# Patient Record
Sex: Female | Born: 1960 | Race: White | Hispanic: No | Marital: Single | Smoking: Current every day smoker
Health system: Southern US, Community
[De-identification: ages and names within clinical notes are randomized; demographics above are authoritative.]

## PROBLEM LIST (undated history)

## (undated) DIAGNOSIS — F1910 Other psychoactive substance abuse, uncomplicated: Secondary | ICD-10-CM

## (undated) DIAGNOSIS — T7840XA Allergy, unspecified, initial encounter: Secondary | ICD-10-CM

## (undated) DIAGNOSIS — R011 Cardiac murmur, unspecified: Secondary | ICD-10-CM

## (undated) DIAGNOSIS — J449 Chronic obstructive pulmonary disease, unspecified: Secondary | ICD-10-CM

## (undated) DIAGNOSIS — I10 Essential (primary) hypertension: Secondary | ICD-10-CM

## (undated) HISTORY — DX: Other psychoactive substance abuse, uncomplicated: F19.10

## (undated) HISTORY — DX: Allergy, unspecified, initial encounter: T78.40XA

## (undated) HISTORY — PX: APPENDECTOMY: SHX54

## (undated) HISTORY — DX: Cardiac murmur, unspecified: R01.1

## (undated) HISTORY — PX: HERNIA REPAIR: SHX51

---

## 2008-04-01 ENCOUNTER — Emergency Department: Payer: Self-pay | Admitting: Emergency Medicine

## 2010-03-28 ENCOUNTER — Emergency Department (HOSPITAL_COMMUNITY): Admission: EM | Admit: 2010-03-28 | Discharge: 2010-03-28 | Payer: Self-pay | Admitting: Emergency Medicine

## 2010-09-21 ENCOUNTER — Inpatient Hospital Stay: Payer: Self-pay | Admitting: Psychiatry

## 2011-02-05 LAB — BASIC METABOLIC PANEL
BUN: 8 mg/dL (ref 6–23)
CO2: 25 mEq/L (ref 19–32)
Calcium: 9.3 mg/dL (ref 8.4–10.5)
Calcium: 9.3 mg/dL (ref 8.4–10.5)
Chloride: 105 mEq/L (ref 96–112)
Creatinine, Ser: 0.68 mg/dL (ref 0.4–1.2)
GFR calc Af Amer: >60 mL/min (ref >60)
GFR calc non Af Amer: >60 mL/min (ref >60)
Glucose, Bld: 113 mg/dL — ABNORMAL HIGH (ref 70–99)
Glucose, Bld: 118 mg/dL — ABNORMAL HIGH (ref 70–99)
Sodium: 138 mEq/L (ref 135–145)

## 2011-02-05 LAB — ETHANOL: Alcohol, Ethyl (B): <5 mg/dL (ref 0–10)

## 2011-02-05 LAB — DIFFERENTIAL
Basophils Relative: 1 % (ref 0–1)
Eosinophils Absolute: 0 10*3/uL (ref 0.0–0.7)
Eosinophils Relative: 0 % (ref 0–5)
Lymphs Abs: 2.5 10*3/uL (ref 0.7–4.0)
Monocytes Relative: 6 % (ref 3–12)
Neutrophils Relative %: 50 % (ref 43–77)

## 2011-02-05 LAB — HEPATIC FUNCTION PANEL
Albumin: 4.3 g/dL (ref 3.5–5.2)
Alkaline Phosphatase: 52 U/L (ref 39–117)
Bilirubin, Direct: <0.1 mg/dL (ref 0.0–0.3)
Total Bilirubin: 0.5 mg/dL (ref 0.3–1.2)

## 2011-02-05 LAB — TRICYCLICS SCREEN, URINE: TCA Scrn: NOT DETECTED

## 2011-02-05 LAB — CBC
HCT: 36.5 % (ref 36.0–46.0)
MCHC: 34.3 g/dL (ref 30.0–36.0)
MCV: 106 fL — ABNORMAL HIGH (ref 78.0–100.0)
RBC: 3.44 MIL/uL — ABNORMAL LOW (ref 3.87–5.11)

## 2011-02-05 LAB — RAPID URINE DRUG SCREEN, HOSP PERFORMED
Cocaine: NOT DETECTED
Tetrahydrocannabinol: NOT DETECTED

## 2011-02-05 LAB — SALICYLATE LEVEL: Salicylate Lvl: <4 mg/dL (ref 2.8–20.0)

## 2012-11-30 ENCOUNTER — Emergency Department: Payer: Self-pay | Admitting: Emergency Medicine

## 2012-11-30 LAB — DRUG SCREEN, URINE
Barbiturates, Ur Screen: NEGATIVE (ref ?–200)
Cannabinoid 50 Ng, Ur ~~LOC~~: NEGATIVE (ref ?–50)
Opiate, Ur Screen: POSITIVE (ref ?–300)
Tricyclic, Ur Screen: NEGATIVE (ref ?–1000)

## 2012-11-30 LAB — COMPREHENSIVE METABOLIC PANEL
Albumin: 4.3 g/dL (ref 3.4–5.0)
Anion Gap: 8 (ref 7–16)
BUN: 17 mg/dL (ref 7–18)
EGFR (Non-African Amer.): >60
Potassium: 3.3 mmol/L — ABNORMAL LOW (ref 3.5–5.1)
SGOT(AST): 20 U/L (ref 15–37)

## 2012-11-30 LAB — ACETAMINOPHEN LEVEL: Acetaminophen: <2 ug/mL

## 2012-11-30 LAB — CBC
Platelet: 200 10*3/uL (ref 150–440)
WBC: 5.8 10*3/uL (ref 3.6–11.0)

## 2013-12-08 ENCOUNTER — Emergency Department: Payer: Self-pay | Admitting: Emergency Medicine

## 2013-12-08 LAB — COMPREHENSIVE METABOLIC PANEL
ALK PHOS: 71 U/L
ALT: 29 U/L (ref 12–78)
ANION GAP: 5 — AB (ref 7–16)
AST: 21 U/L (ref 15–37)
Albumin: 4.1 g/dL (ref 3.4–5.0)
BILIRUBIN TOTAL: 0.3 mg/dL (ref 0.2–1.0)
BUN: 14 mg/dL (ref 7–18)
CALCIUM: 9.2 mg/dL (ref 8.5–10.1)
CHLORIDE: 105 mmol/L (ref 98–107)
CO2: 30 mmol/L (ref 21–32)
CREATININE: 0.85 mg/dL (ref 0.60–1.30)
EGFR (Non-African Amer.): >60
GLUCOSE: 88 mg/dL (ref 65–99)
OSMOLALITY: 279 (ref 275–301)
POTASSIUM: 3.9 mmol/L (ref 3.5–5.1)
SODIUM: 140 mmol/L (ref 136–145)
TOTAL PROTEIN: 7.9 g/dL (ref 6.4–8.2)

## 2013-12-08 LAB — CBC WITH DIFFERENTIAL/PLATELET
BASOS PCT: 0.4 %
Basophil #: 0 10*3/uL (ref 0.0–0.1)
Eosinophil #: 0 10*3/uL (ref 0.0–0.7)
Eosinophil %: 0.1 %
HCT: 36.7 % (ref 35.0–47.0)
HGB: 12.4 g/dL (ref 12.0–16.0)
LYMPHS ABS: 2.8 10*3/uL (ref 1.0–3.6)
LYMPHS PCT: 51.7 %
MCH: 34.2 pg — ABNORMAL HIGH (ref 26.0–34.0)
MCHC: 33.9 g/dL (ref 32.0–36.0)
MCV: 101 fL — ABNORMAL HIGH (ref 80–100)
MONO ABS: 0.4 x10 3/mm (ref 0.2–0.9)
Monocyte %: 7.6 %
Neutrophil #: 2.2 10*3/uL (ref 1.4–6.5)
Neutrophil %: 40.2 %
Platelet: 174 10*3/uL (ref 150–440)
RBC: 3.64 10*6/uL — ABNORMAL LOW (ref 3.80–5.20)
RDW: 13.4 % (ref 11.5–14.5)
WBC: 5.5 10*3/uL (ref 3.6–11.0)

## 2013-12-08 LAB — LIPASE, BLOOD: Lipase: 80 U/L (ref 73–393)

## 2013-12-08 LAB — URINALYSIS, COMPLETE
BACTERIA: NONE SEEN
Bilirubin,UR: NEGATIVE
Glucose,UR: NEGATIVE mg/dL (ref 0–75)
KETONE: NEGATIVE
LEUKOCYTE ESTERASE: NEGATIVE
Nitrite: NEGATIVE
PH: 5 (ref 4.5–8.0)
Protein: NEGATIVE
RBC, UR: NONE SEEN /HPF (ref 0–5)
Specific Gravity: 1.006 (ref 1.003–1.030)

## 2013-12-08 LAB — TROPONIN I

## 2014-03-04 ENCOUNTER — Emergency Department: Payer: Self-pay | Admitting: Emergency Medicine

## 2015-03-10 NOTE — Consult Note (Signed)
PATIENT NAME:  Alexa Thompson, Alexa Thompson MR#:  [id number] DATE OF BIRTH:  14-Dec-1960  DATE OF ADMISSION:  11/30/2012  DATE OF CONSULTATION:  11/30/2012  REFERRING PHYSICIAN:   Dr. Conni Slipper.   CONSULTING PHYSICIAN:  Leiana Rund B. Livingston Denner, MD  REASON FOR CONSULTATION:  To evaluate the patient in need of narcotic detox.   IDENTIFYING DATA:  The patient is a 54 year old female with history of narcotic abuse.   CHIEF COMPLAINT:  "I need treatment."   HISTORY OF PRESENT ILLNESS:  The patient was hospitalized at [facility] in 2011 for a similar problem.   At that time, she was transferred to [facility] rehab facility in [place]. She did very well for 4 or 5 months using naltrexone prescribed at the rehab facility. Her husband, who despises drugs, requested that she goes off naltrexone. He felt that using naltrexone is just substituting one drug for another. She immediately relapsed on pain killers and has been using them daily ever since. She now was confronted by her husband, who wants to separate if she does not stop. She accumulated debts, created problems in her marriage, and threatened not to be able to see her grandchildren, which has been a threat all along that the husband uses to help her get treatment. She came to the hospital asking for an opioid detox. She denies any symptoms of depression, anxiety, psychosis. No symptoms of bipolar mania. She denies, other than opioids, substance use, including alcohol.   PAST PSYCHIATRIC HISTORY:  She became addicted to pain killers after dental surgery more than 10 years ago. She has not been able to stay sober in spite of efforts, except for the period of time when she was hospitalized at [facility] and on naltrexone.  She was hospitalized for detox in 2011. She denies suicide attempts. She does not have a psychiatrist.   FAMILY PSYCHIATRIC HISTORY:  None reported.   PAST MEDICAL HISTORY:  None.   MEDICATIONS ON ADMISSION:  None.   ALLERGIES:  No  known drug allergies.   SOCIAL HISTORY:  She is originally from [place]. She dropped out in the 11th grade but got her GED. She is in her second marriage. The husband is supportive but demands that she stays clean. She works at [employer] and needs to keep this job; therefore, she will not be able to participate in residential rehab treatment. She hopes that detox will be would enough, especially if someone would be willing to prescribe naltrexone.   REVIEW OF SYSTEMS:  CONSTITUTIONAL:  No fevers or chills. No weight changes.  EYES:  No double or blurred vision.  EAR, NOSE, THROAT:  No hearing losses.  RESPIRATORY:  No shortness of breath or cough.  CARDIOVASCULAR:  No chest pain or orthopnea.  GASTROINTESTINAL:  No abdominal pain, nausea, vomiting or diarrhea. She had her last dose yesterday.  GENITOURINARY:  No incontinence or frequency.  ENDOCRINE:  No heat or cold intolerance.  LYMPHATIC:  No anemia or easy bruising.  INTEGUMENTARY:  No acne or rash.  MUSCULOSKELETAL:  No muscle or joint pain.  NEUROLOGIC:  No tingling or weakness.  PSYCHIATRIC:  See history of present illness for details.    PHYSICAL EXAMINATION:  VITAL SIGNS:  Blood pressure 144/84, pulse 89, respirations 20, temperature 97.9.  GENERAL:  This is a slender female in no acute distress. The rest of the physical examination is deferred to her primary attending.   LABORATORY DATA:  Chemistries within normal limits except for blood glucose  of 113 and potassium 3.3. Blood alcohol level is 0. LFTs within normal limits. TSH 0.77. Urine tox screen positive for opiates. CBC within normal limits. Serum acetaminophen and salicylates are low.   MENTAL STATUS EXAMINATION:  The patient is alert and oriented to person, place, time and situation. She is pleasant, polite and cooperative. She is well groomed. She is wearing hospital scrubs. She maintains good eye contact. Her speech is soft. Mood is fine with slightly anxious affect.  Thought processing is logical and goal oriented. Thought content: She denies suicidal or homicidal ideation. There are no delusions or paranoia. There are no auditory or visual hallucinations. Her cognition is grossly intact. She registers 3 out of 3 and recalls 3 out of 3 objects after 5 minutes. She can spell world forward and backward. She knows the current president.   SUICIDE RISK ASSESSMENT:  This is a patient with a long history of opiate abuse but not mood symptoms, has never been suicidal. She seems to be interested in getting clean and participating in substance abuse treatment program as an outpatient.   DIAGNOSES: AXIS I:  Opiate dependence.  AXIS II:  Deferred.  AXIS III:  Deferred.  AXIS IV:  Substance abuse, marital conflict, financial.  AXIS V:  Global Assessment of Functioning  45.   PLAN:  The patient was accepted to [facility] for opioid detox.  She will be transferred tonight.      ____________________________ Wardell Honour. Iori Gigante, MD jbp:dm D: 12/01/2012 17:25:00 ET T: 12/01/2012 20:06:15 ET JOB#: [id number]  cc: Viann Nielson B. Bary Leriche, MD, <Dictator> Clovis Fredrickson MD ELECTRONICALLY SIGNED 12/03/2012 7:48

## 2015-03-10 NOTE — Consult Note (Signed)
Brief Consult Note: Diagnosis: Opioid dependence.   Patient was seen by consultant.   Consult note dictated.   Recommend further assessment or treatment.   Orders entered.   Comments: Alexa Thompson has a h/o opioid dependence. She is requesting detox. She took her last dose yesterday. She is not in withdrawal yet.  PLAN: 1. The patient is accepted to [facility]. Transfer tonight.  2. No medications recommended at this point.  Electronic Signatures: Orson Slick (MD)  (Signed 13-Jan-14 19:09)  Authored: Brief Consult Note   Last Updated: 13-Jan-14 19:09 by Orson Slick (MD)

## 2016-02-15 ENCOUNTER — Encounter: Payer: Self-pay | Admitting: Physician Assistant

## 2016-02-15 ENCOUNTER — Ambulatory Visit: Payer: Self-pay | Admitting: Physician Assistant

## 2016-02-15 VITALS — BP 128/72 | HR 77 | Temp 98.1°F | Ht 64.5 in | Wt 138.0 lb

## 2016-02-15 DIAGNOSIS — F17210 Nicotine dependence, cigarettes, uncomplicated: Secondary | ICD-10-CM | POA: Insufficient documentation

## 2016-02-15 DIAGNOSIS — Z13220 Encounter for screening for lipoid disorders: Secondary | ICD-10-CM

## 2016-02-15 DIAGNOSIS — F1911 Other psychoactive substance abuse, in remission: Secondary | ICD-10-CM

## 2016-02-15 DIAGNOSIS — Z1211 Encounter for screening for malignant neoplasm of colon: Secondary | ICD-10-CM

## 2016-02-15 DIAGNOSIS — Z131 Encounter for screening for diabetes mellitus: Secondary | ICD-10-CM

## 2016-02-15 DIAGNOSIS — F1111 Opioid abuse, in remission: Secondary | ICD-10-CM | POA: Insufficient documentation

## 2016-02-15 DIAGNOSIS — Z1239 Encounter for other screening for malignant neoplasm of breast: Secondary | ICD-10-CM

## 2016-02-15 LAB — GLUCOSE, POCT (MANUAL RESULT ENTRY): POC Glucose: 125 mg/dl — AB (ref 70–99)

## 2016-02-15 NOTE — Progress Notes (Signed)
BP 128/72 mmHg  Pulse 77  Temp(Src) 98.1 F (36.7 C)  Ht 5' 4.5" (1.638 m)  Wt 138 lb (62.596 kg)  BMI 23.33 kg/m2  SpO2 98%   Subjective:    Patient ID: Alexa Thompson, female    DOB: 01-12-1961, 55 y.o.   MRN: [id number]  HPI: Alexa Thompson is a 55 y.o. female presenting on 02/15/2016 for New Patient (Initial Visit); Allergies; and Umbilical Hernia   HPI Chief Complaint  Patient presents with  . New Patient (Initial Visit)    pt moved from [facility] to [facility] on 02-02-16  . Allergies    needs clearance to take allergy meds  . Umbilical Hernia    Pt living at [facility]  Requests melatonoin, chlor-trimeton and ibu 800  Pt in jail in 2016. States hernia flared up while in jail.  Pt states otc ibu didn't work well for it.   Relevant past medical, surgical, family and social history reviewed and updated as indicated. Interim medical history since our last visit reviewed. Allergies and medications reviewed and updated.  CURRENT MEDS: None  Review of Systems  Constitutional: Negative for fever, chills, diaphoresis, appetite change, fatigue and unexpected weight change.  HENT: Positive for congestion, dental problem and sneezing. Negative for drooling, ear pain, facial swelling, hearing loss, mouth sores, sore throat, trouble swallowing and voice change.   Eyes: Negative for pain, discharge, redness, itching and visual disturbance.  Respiratory: Positive for cough. Negative for choking, shortness of breath and wheezing.   Cardiovascular: Negative for chest pain, palpitations and leg swelling.  Gastrointestinal: Negative for vomiting, abdominal pain, diarrhea, constipation and blood in stool.  Endocrine: Negative for cold intolerance, heat intolerance and polydipsia.  Genitourinary: Negative for dysuria, hematuria and decreased urine volume.  Musculoskeletal: Negative for back pain, arthralgias and gait problem.  Skin: Negative for rash.  Allergic/Immunologic:  Negative for environmental allergies.  Neurological: Negative for seizures, syncope, light-headedness and headaches.  Hematological: Negative for adenopathy.  Psychiatric/Behavioral: Negative for suicidal ideas, dysphoric mood and agitation. The patient is not nervous/anxious.     Per HPI unless specifically indicated above     Objective:    BP 128/72 mmHg  Pulse 77  Temp(Src) 98.1 F (36.7 C)  Ht 5' 4.5" (1.638 m)  Wt 138 lb (62.596 kg)  BMI 23.33 kg/m2  SpO2 98%  Wt Readings from Last 3 Encounters:  02/15/16 138 lb (62.596 kg)    Physical Exam  Constitutional: She is oriented to person, place, and time. She appears well-developed and well-nourished.  HENT:  Head: Normocephalic and atraumatic.  Mouth/Throat: Oropharynx is clear and moist. No oropharyngeal exudate.  Eyes: Conjunctivae and EOM are normal. Pupils are equal, round, and reactive to light.  Neck: Neck supple. No thyromegaly present.  Cardiovascular: Normal rate and regular rhythm.   Pulmonary/Chest: Effort normal and breath sounds normal.  Abdominal: Soft. Bowel sounds are normal. She exhibits no mass. There is no hepatosplenomegaly. There is no tenderness. No hernia.  Musculoskeletal: She exhibits no edema.  Lymphadenopathy:    She has no cervical adenopathy.  Neurological: She is alert and oriented to person, place, and time. Gait normal.  Skin: Skin is warm and dry.  Psychiatric: She has a normal mood and affect. Her behavior is normal.  Vitals reviewed.       Assessment & Plan:   Encounter Diagnoses  Name Primary?  . Cigarette nicotine dependence without complication Yes  . Substance abuse in remission   .  Screening cholesterol level   . Screening for diabetes mellitus   . Special screening for malignant neoplasms, colon   . Screening for breast cancer    -get Baseline labs- will call with results -Ok to take meds- list given -order mammo -pt will Get pap in [place]- has f/u scheduled for  next month in [place] -Gave iFOBT -substance abuse counseling provided through [facility] house -F/u 1 year.  RT[facility] sooner prn

## 2016-02-16 LAB — TSH: TSH: 0.7 m[IU]/L

## 2016-02-16 LAB — LIPID PANEL
CHOL/HDL RATIO: 2.5 ratio (ref <=5.0)
CHOLESTEROL: 225 mg/dL — AB (ref 125–200)
HDL: 90 mg/dL (ref >=46)
LDL Cholesterol: 122 mg/dL (ref <130)
TRIGLYCERIDES: 64 mg/dL (ref <150)
VLDL: 13 mg/dL (ref <30)

## 2016-02-16 LAB — CBC WITH DIFFERENTIAL/PLATELET
BASOS ABS: 0 10*3/uL (ref 0.0–0.1)
Basophils Relative: 0 % (ref 0–1)
EOS PCT: 2 % (ref 0–5)
Eosinophils Absolute: 0.1 10*3/uL (ref 0.0–0.7)
HEMATOCRIT: 38.7 % (ref 36.0–46.0)
Hemoglobin: 13.4 g/dL (ref 12.0–15.0)
LYMPHS ABS: 2.1 10*3/uL (ref 0.7–4.0)
LYMPHS PCT: 41 % (ref 12–46)
MCH: 32.1 pg (ref 26.0–34.0)
MCHC: 34.6 g/dL (ref 30.0–36.0)
MCV: 92.8 fL (ref 78.0–100.0)
MONO ABS: 0.4 10*3/uL (ref 0.1–1.0)
MONOS PCT: 7 % (ref 3–12)
MPV: 10.1 fL (ref 8.6–12.4)
Neutro Abs: 2.5 10*3/uL (ref 1.7–7.7)
Neutrophils Relative %: 50 % (ref 43–77)
PLATELETS: 194 10*3/uL (ref 150–400)
RBC: 4.17 MIL/uL (ref 3.87–5.11)
RDW: 14.2 % (ref 11.5–15.5)
WBC: 5 10*3/uL (ref 4.0–10.5)

## 2016-02-16 LAB — COMPLETE METABOLIC PANEL WITH GFR
ALBUMIN: 4.5 g/dL (ref 3.6–5.1)
ALK PHOS: 66 U/L (ref 33–130)
ALT: 10 U/L (ref 6–29)
AST: 18 U/L (ref 10–35)
BUN: 14 mg/dL (ref 7–25)
CALCIUM: 9 mg/dL (ref 8.6–10.4)
CHLORIDE: 105 mmol/L (ref 98–110)
CO2: 26 mmol/L (ref 20–31)
Creat: 0.82 mg/dL (ref 0.50–1.05)
GFR, EST NON AFRICAN AMERICAN: 81 mL/min (ref >=60)
GFR, Est African American: >89 mL/min (ref >=60)
Glucose, Bld: 91 mg/dL (ref 65–99)
POTASSIUM: 4.5 mmol/L (ref 3.5–5.3)
SODIUM: 140 mmol/L (ref 135–146)
Total Bilirubin: 0.4 mg/dL (ref 0.2–1.2)
Total Protein: 6.9 g/dL (ref 6.1–8.1)

## 2016-02-16 LAB — HEMOGLOBIN A1C
Hgb A1c MFr Bld: 5.7 % — ABNORMAL HIGH (ref <5.7)
Mean Plasma Glucose: 117 mg/dL

## 2016-02-19 ENCOUNTER — Other Ambulatory Visit: Payer: Self-pay | Admitting: Physician Assistant

## 2016-02-19 DIAGNOSIS — Z1239 Encounter for other screening for malignant neoplasm of breast: Secondary | ICD-10-CM

## 2016-02-26 ENCOUNTER — Encounter (HOSPITAL_COMMUNITY): Payer: Self-pay

## 2016-02-28 ENCOUNTER — Ambulatory Visit (HOSPITAL_COMMUNITY)
Admission: RE | Admit: 2016-02-28 | Discharge: 2016-02-28 | Disposition: A | Discharge status: Home / Self Care | Payer: Self-pay | Source: Ambulatory Visit | Attending: Physician Assistant | Admitting: Physician Assistant

## 2016-02-28 LAB — IFOBT (OCCULT BLOOD): IFOBT: NEGATIVE

## 2016-03-14 ENCOUNTER — Encounter: Payer: Self-pay | Admitting: Student

## 2016-04-23 ENCOUNTER — Ambulatory Visit: Payer: Self-pay | Admitting: Physician Assistant

## 2016-04-23 ENCOUNTER — Encounter: Payer: Self-pay | Admitting: Physician Assistant

## 2016-04-23 VITALS — BP 116/74 | HR 86 | Temp 98.1°F | Ht 64.5 in | Wt 142.3 lb

## 2016-04-23 DIAGNOSIS — Z711 Person with feared health complaint in whom no diagnosis is made: Secondary | ICD-10-CM

## 2016-04-23 NOTE — Progress Notes (Signed)
   BP 116/74 mmHg  Pulse 86  Temp(Src) 98.1 F (36.7 C)  Ht 5' 4.5" (1.638 m)  Wt 142 lb 4.8 oz (64.547 kg)  BMI 24.06 kg/m2  SpO2 98%   Subjective:    Patient ID: Alexa Thompson, female    DOB: Aug 19, 1961, 55 y.o.   MRN: [id number]  HPI: LAKESHIA CURTI is a 55 y.o. female presenting on 04/23/2016 for Abnormal Lab   HPI   Chief Complaint  Patient presents with  . Abnormal Lab    pt has decreasing creatinine levels and is concerned about it. pt brought in results that were done showing levels.    Pt had urine tests for drug screen.  She is worried b/c she was told her kidney function was bad.  Relevant past medical, surgical, family and social history reviewed and updated as indicated. Interim medical history since our last visit reviewed. Allergies and medications reviewed and updated.  No current outpatient prescriptions on file.   Review of Systems  Constitutional: Negative for fever, chills, diaphoresis, appetite change, fatigue and unexpected weight change.  HENT: Positive for dental problem. Negative for congestion, drooling, ear pain, facial swelling, hearing loss, mouth sores, sneezing, sore throat, trouble swallowing and voice change.   Eyes: Negative for pain, discharge, redness, itching and visual disturbance.  Respiratory: Negative for cough, choking, shortness of breath and wheezing.   Cardiovascular: Negative for chest pain, palpitations and leg swelling.  Gastrointestinal: Negative for vomiting, abdominal pain, diarrhea, constipation and blood in stool.  Endocrine: Negative for cold intolerance, heat intolerance and polydipsia.  Genitourinary: Negative for dysuria, hematuria and decreased urine volume.  Musculoskeletal: Negative for back pain, arthralgias and gait problem.  Skin: Negative for rash.  Allergic/Immunologic: Positive for environmental allergies.  Neurological: Negative for seizures, syncope, light-headedness and headaches.  Hematological: Negative  for adenopathy.  Psychiatric/Behavioral: Negative for suicidal ideas, dysphoric mood and agitation. The patient is not nervous/anxious.     Per HPI unless specifically indicated above     Objective:    BP 116/74 mmHg  Pulse 86  Temp(Src) 98.1 F (36.7 C)  Ht 5' 4.5" (1.638 m)  Wt 142 lb 4.8 oz (64.547 kg)  BMI 24.06 kg/m2  SpO2 98%  Wt Readings from Last 3 Encounters:  04/23/16 142 lb 4.8 oz (64.547 kg)  02/15/16 138 lb (62.596 kg)    Physical Exam  Constitutional: She is oriented to person, place, and time. She appears well-developed and well-nourished.  Pulmonary/Chest: Effort normal.  Neurological: She is alert and oriented to person, place, and time.  Skin: Skin is warm and dry.  Psychiatric: She has a normal mood and affect. Her behavior is normal.  Nursing note and vitals reviewed.       Assessment & Plan:   Encounter Diagnosis  Name Primary?  Marland Kitchen No problem, feared complaint unfounded Yes     -Reviewed with pt her blood tests from march this year as well as cmp from 2015 , both of which showed excellent kidney function. Pt is reassured. -F/u as scheduled

## 2017-02-13 ENCOUNTER — Ambulatory Visit: Payer: Self-pay | Admitting: Physician Assistant

## 2017-02-24 ENCOUNTER — Encounter: Payer: Self-pay | Admitting: Physician Assistant

## 2019-01-20 ENCOUNTER — Encounter: Payer: Self-pay | Admitting: Physician Assistant

## 2019-01-20 ENCOUNTER — Ambulatory Visit (INDEPENDENT_AMBULATORY_CARE_PROVIDER_SITE_OTHER): Payer: BLUE CROSS/BLUE SHIELD | Admitting: Physician Assistant

## 2019-01-20 VITALS — BP 140/80 | HR 73 | Temp 97.8°F | Resp 16 | Wt 136.0 lb

## 2019-01-20 DIAGNOSIS — F1111 Opioid abuse, in remission: Secondary | ICD-10-CM

## 2019-01-20 DIAGNOSIS — F1010 Alcohol abuse, uncomplicated: Secondary | ICD-10-CM | POA: Diagnosis not present

## 2019-01-20 DIAGNOSIS — R05 Cough: Secondary | ICD-10-CM | POA: Diagnosis not present

## 2019-01-20 DIAGNOSIS — Z720 Tobacco use: Secondary | ICD-10-CM | POA: Diagnosis not present

## 2019-01-20 DIAGNOSIS — K469 Unspecified abdominal hernia without obstruction or gangrene: Secondary | ICD-10-CM

## 2019-01-20 DIAGNOSIS — R059 Cough, unspecified: Secondary | ICD-10-CM

## 2019-01-20 NOTE — Progress Notes (Signed)
Patient: Alexa Thompson, Female    DOB: 25-Sep-1961, 58 y.o.   MRN: [id number] Visit Date: 01/20/2019  Today's Provider: Trinna Post, PA-C   Chief Complaint  Patient presents with  . New Patient (Initial Visit)   Subjective:     Establish Care;  Alexa Thompson is a 58 y.o. female who presents today to establish care for health maintenance and complete physical. She feels fairly well. She reports no regular exercising . She reports she is sleeping poorly.  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 She has not been followed by a PCP in many years.   Tobacco abuse: 1/2 pack a day currently. Smoking for 17 years average of pack a day, quit for 10, smoking 12 years again average one pack per day. Total pack years 29 years roughly.   She has a history of abusing percocet and was in rehabilitation in 2012. She also said she used vicodin as well. She reports she never injected medication. She reports she never used heroin or cocaine.   Currently, she will drink 6-12 beers within one week. She reports she is currently undergoing a class after having received a DWI. She reports she doesn't know if she is dependent on alcohol but feels like she might not be because she doesn't seem to go through withdrawals when she stops. She used to go to NA but does not go to this any longer.   She reports she has a history of what she thinks is an abdominal hernia. She reports many years ago in [place] she was treated in office procedure by surgeon where she says he gave her a valium, made a small incision and repaired her hernia.   She reports she has had a cough for several years, brings up some mucous. Occasionally has SOB.  -----------------------------------------------------------------   Review of Systems  HENT: Positive for congestion.   Respiratory: Positive for cough (productive with dark grey sputum), shortness of breath and wheezing.    Gastrointestinal: Positive for abdominal pain (possible hernia).  Skin: Positive for rash.  Allergic/Immunologic: Positive for environmental allergies.  Hematological: Bruises/bleeds easily.  Psychiatric/Behavioral: Positive for sleep disturbance.  All other systems reviewed and are negative.   Social History      She  reports that she has been smoking cigarettes. She has a 14.00 pack-year smoking history. She has never used smokeless tobacco. She reports current alcohol use of about 4.0 standard drinks of alcohol per week. She reports current drug use. Drugs: "Crack" cocaine, Marijuana, Benzodiazepines, and Other-see comments.       Social History   Socioeconomic History  . Marital status: Single    Spouse name: Not on file  . Number of children: Not on file  . Years of education: Not on file  . Highest education level: Not on file  Occupational History  . Not on file  Social Needs  . Financial resource strain: Not on file  . Food insecurity:    Worry: Not on file    Inability: Not on file  . Transportation needs:    Medical: Not on file    Non-medical: Not on file  Tobacco Use  . Smoking status: Current Every Day Smoker    Packs/day: 0.50    Years: 28.00    Pack years: 14.00    Types: Cigarettes  . Smokeless tobacco: Never Used  Substance and Sexual Activity  . Alcohol use:  Yes    Alcohol/week: 4.0 standard drinks    Types: 4 Cans of beer per week    Comment: previous heavy drinker October, 2016  . Drug use: Yes    Types: "Crack" cocaine, Marijuana, Benzodiazepines, Other-see comments    Comment: recovering. last use of Cocaine was February 01, 2016  . Sexual activity: Not on file  Lifestyle  . Physical activity:    Days per week: Not on file    Minutes per session: Not on file  . Stress: Not on file  Relationships  . Social connections:    Talks on phone: Not on file    Gets together: Not on file    Attends religious service: Not on file    Active member of  club or organization: Not on file    Attends meetings of clubs or organizations: Not on file    Relationship status: Not on file  Other Topics Concern  . Not on file  Social History Narrative  . Not on file    Past Medical History:  Diagnosis Date  . Allergy   . Heart murmur   . Substance abuse [facility])      Patient Active Problem List   Diagnosis Date Noted  . Alcohol abuse 01/20/2019  . Cigarette nicotine dependence without complication 29/92/4268  . History of opioid abuse ([place]) 02/15/2016    Past Surgical History:  Procedure Laterality Date  . APPENDECTOMY  age 77ish  . CESAREAN SECTION  age 62  . HERNIA REPAIR  age 12ish    Family History        Family Status  Relation Name Status  . Mother  Deceased  . Father  Alive        Her family history includes COPD in her mother; Diabetes in her father; Heart disease in her father; Hypertension in her mother; Kidney disease in her mother.      No Known Allergies  No current outpatient medications on file.   Patient Care Team: Paulene Floor as PCP - General (Physician Assistant)    Objective:    Vitals: BP 140/80 (BP Location: Left Arm, Patient Position: Sitting, Cuff Size: Normal)   Pulse 73   Temp 97.8 F (36.6 C) (Oral)   Resp 16   Wt 136 lb (61.7 kg)   LMP  (LMP Unknown)   SpO2 97% Comment: room air  BMI 22.98 kg/m    Vitals:   01/20/19 1100  BP: 140/80  Pulse: 73  Resp: 16  Temp: 97.8 F (36.6 C)  TempSrc: Oral  SpO2: 97%  Weight: 136 lb (61.7 kg)     Physical Exam Constitutional:      Appearance: Normal appearance. She is normal weight.  HENT:     Right Ear: Tympanic membrane normal.     Left Ear: Tympanic membrane normal.     Mouth/Throat:     Mouth: Mucous membranes are moist.     Pharynx: Oropharynx is clear.  Cardiovascular:     Rate and Rhythm: Normal rate and regular rhythm.     Heart sounds: Normal heart sounds.  Pulmonary:     Effort: Pulmonary effort is normal.      Breath sounds: Normal breath sounds.  Abdominal:     General: Abdomen is flat. Bowel sounds are normal.     Palpations: Abdomen is soft. There is no mass.     Tenderness: There is no abdominal tenderness.  Skin:    General: Skin is warm and  dry.  Neurological:     Mental Status: She is oriented to person, place, and time. Mental status is at baseline.  Psychiatric:        Mood and Affect: Mood normal.        Behavior: Behavior normal.      Depression Screen PHQ 2/9 Scores 01/20/2019  PHQ - 2 Score 2  PHQ- 9 Score 9       Assessment & Plan:     Routine Health Maintenance and Physical Exam  Exercise Activities and Dietary recommendations Goals   None      There is no immunization history on file for this patient.  Health Maintenance  Topic Date Due  . Hepatitis C Screening  1961-07-21  . HIV Screening  04/09/1976  . TETANUS/TDAP  04/09/1980  . PAP SMEAR-Modifier  04/09/1982  . COLONOSCOPY  04/10/2011  . MAMMOGRAM  02/27/2018  . INFLUENZA VACCINE  06/18/2018     Discussed health benefits of physical activity, and encouraged her to engage in regular exercise appropriate for her age and condition.    1. Tobacco abuse  Spirometry is normal and does not show COPD today. She continues to smoke. Will refer for low dose CT chest for lung cancer screening. I have given her a one month trial of Spiriva to see if this helps with any coughing and breathing troubles. Return in one month for CPE w/ PAP.   - Ambulatory Referral for Lung Cancer Scre  2. Cough  - Spirometry with Graph  3. Abdominal hernia without obstruction and without gangrene, recurrence not specified, unspecified hernia type  Do not appreciate hernia today on exam. Patient recalls an in office procedure where she reports her hernia was fixed, I am not aware of what this would be. Offered referral to surgery, counseled on risks of strangulation and advised to use stool softener. She would like to hold  off for right now, may consider this in the future.   4. Alcohol abuse  Patient reports she cannot drink casually, cannot stop at just one drink, drinks 12 beers a week and is currently undergoing classes after receiving a DWI. I have advised her this constitutes alcohol abuse and she should not be drinking anything. Encouraged her to attend AA or NA.  The entirety of the information documented in the History of Present Illness, Review of Systems and Physical Exam were personally obtained by me. Portions of this information were initially documented by Meyer Cory and reviewed by me for thoroughness and accuracy.   Return in about 1 month (around 02/20/2019) for CPE. --------------------------------------------------------------------    Trinna Post, PA-C  [facility]

## 2019-01-20 NOTE — Patient Instructions (Signed)
COPD and Physical Activity  Chronic obstructive pulmonary disease (COPD) is a long-term (chronic) condition that affects the lungs. COPD is a general term that can be used to describe many different lung problems that cause lung swelling (inflammation) and limit airflow, including chronic bronchitis and emphysema.  The main symptom of COPD is shortness of breath, which makes it harder to do even simple tasks. This can also make it harder to exercise and be active. Talk with your health care provider about treatments to help you breathe better and actions you can take to prevent breathing problems during physical activity.  What are the benefits of exercising with COPD?  Exercising regularly is an important part of a healthy lifestyle. You can still exercise and do physical activities even though you have COPD. Exercise and physical activity improve your shortness of breath by increasing blood flow (circulation). This causes your heart to pump more oxygen through your body. Moderate exercise can improve your:  · Oxygen use.  · Energy level.  · Shortness of breath.  · Strength in your breathing muscles.  · Heart health.  · Sleep.  · Self-esteem and feelings of self-worth.  · Depression, stress, and anxiety levels.  Exercise can benefit everyone with COPD. The severity of your disease may affect how hard you can exercise, especially at first, but everyone can benefit. Talk with your health care provider about how much exercise is safe for you, and which activities and exercises are safe for you.  What actions can I take to prevent breathing problems during physical activity?  · Sign up for a pulmonary rehabilitation program. This type of program may include:  ? Education about lung diseases.  ? Exercise classes that teach you how to exercise and be more active while improving your breathing. This usually involves:  § Exercise using your lower extremities, such as a stationary bicycle.  § About 30 minutes of exercise, 2  to 5 times per week, for 6 to 12 weeks  § Strength training, such as push ups or leg lifts.  ? Nutrition education.  ? Group classes in which you can talk with others who also have COPD and learn ways to manage stress.  · If you use an oxygen tank, you should use it while you exercise. Work with your health care provider to adjust your oxygen for your physical activity. Your resting flow rate is different from your flow rate during physical activity.  · While you are exercising:  ? Take slow breaths.  ? Pace yourself and do not try to go too fast.  ? Purse your lips while breathing out. Pursing your lips is similar to a kissing or whistling position.  ? If doing exercise that uses a quick burst of effort, such as weight lifting:  § Breathe in before starting the exercise.  § Breathe out during the hardest part of the exercise (such as raising the weights).  Where to find support  You can find support for exercising with COPD from:  · Your health care provider.  · A pulmonary rehabilitation program.  · Your local health department or community health programs.  · Support groups, online or in-person. Your health care provider may be able to recommend support groups.  Where to find more information  You can find more information about exercising with COPD from:  · American Lung Association: lung.org.  · COPD Foundation: copdfoundation.org.  Contact a health care provider if:  · Your symptoms get worse.  · You   have chest pain.  · You have nausea.  · You have a fever.  · You have trouble talking or catching your breath.  · You want to start a new exercise program or a new activity.  Summary  · COPD is a general term that can be used to describe many different lung problems that cause lung swelling (inflammation) and limit airflow. This includes chronic bronchitis and emphysema.  · Exercise and physical activity improve your shortness of breath by increasing blood flow (circulation). This causes your heart to provide more  oxygen to your body.  · Contact your health care provider before starting any exercise program or new activity. Ask your health care provider what exercises and activities are safe for you.  This information is not intended to replace advice given to you by your health care provider. Make sure you discuss any questions you have with your health care provider.  Document Released: 11/27/2017 Document Revised: 11/27/2017 Document Reviewed: 11/27/2017  Elsevier Interactive Patient Education © 2019 Elsevier Inc.

## 2019-01-21 ENCOUNTER — Encounter: Payer: Self-pay | Admitting: *Deleted

## 2019-01-21 ENCOUNTER — Telehealth: Payer: Self-pay | Admitting: *Deleted

## 2019-01-21 DIAGNOSIS — Z87891 Personal history of nicotine dependence: Secondary | ICD-10-CM

## 2019-01-21 DIAGNOSIS — Z122 Encounter for screening for malignant neoplasm of respiratory organs: Secondary | ICD-10-CM

## 2019-01-21 NOTE — Telephone Encounter (Signed)
Received referral for initial lung cancer screening scan. Contacted patient and obtained smoking history,(current, 67.5 pack year) as well as answering questions related to screening process. Patient denies signs of lung cancer such as weight loss or hemoptysis. Patient denies comorbidity that would prevent curative treatment if lung cancer were found. Patient is scheduled for shared decision making visit and CT scan on 02/11/19 at 10am.

## 2019-02-04 ENCOUNTER — Telehealth: Payer: Self-pay | Admitting: *Deleted

## 2019-02-04 NOTE — Telephone Encounter (Signed)
Patient notified/message left to notify patient that due to current restrictions lung screening appointments are cancelled and patient will be contacted regarding rescheduling.  

## 2019-02-10 ENCOUNTER — Telehealth: Payer: Self-pay | Admitting: Physician Assistant

## 2019-02-10 NOTE — Telephone Encounter (Signed)
Pt would like to pick up a sample of the Spriva Inhaler if we have any in stock.  She said she was in about two weeks a go and was given a sample then.    Could a nurse please call her back and let her know if she can come by and pick one up  CB# [phone]  Thanks. Con Memos

## 2019-02-10 NOTE — Telephone Encounter (Signed)
Yes it's fine if she does not have fever/respiratory symptoms and we have it. I can send her in a script too.

## 2019-02-10 NOTE — Telephone Encounter (Signed)
Is this ok?

## 2019-02-11 ENCOUNTER — Ambulatory Visit: Payer: Self-pay

## 2019-02-11 ENCOUNTER — Inpatient Hospital Stay: Payer: Self-pay | Admitting: Nurse Practitioner

## 2019-02-11 MED ORDER — TIOTROPIUM BROMIDE MONOHYDRATE 2.5 MCG/ACT IN AERS: 2.0000 | INHALATION_SPRAY | Freq: Every day | RESPIRATORY_TRACT | 0 refills | Status: DC

## 2019-02-11 NOTE — Telephone Encounter (Signed)
2.5 mcg, 2 puffs daily.

## 2019-02-11 NOTE — Telephone Encounter (Signed)
Please advise. Which dose of Spiriva samples are we giving patient, and what are the directions for usage?  I couldn't find documentation of previous sample dose.

## 2019-02-11 NOTE — Telephone Encounter (Signed)
Dosage verified with Adriana and samples placed up front for pick up ([address]). Patient advised and states that she does not have any fever or other respiratory symptoms. Patient wants to use the samples for now, and doesn't need a prescription sent in. Patient plans to pick up samples today and would like someone to bring them down to her car. I advised patient to call once she was in the parking lot and someone may be able to bring samples to her.

## 2019-03-05 ENCOUNTER — Encounter: Payer: Self-pay | Admitting: Physician Assistant

## 2019-03-31 ENCOUNTER — Telehealth: Payer: Self-pay | Admitting: *Deleted

## 2019-03-31 NOTE — Telephone Encounter (Signed)
Patient is contacted and rescheduled for lung screening scan. See prior note for eligibility information.

## 2019-04-09 ENCOUNTER — Encounter: Payer: Self-pay | Admitting: Physician Assistant

## 2019-04-09 ENCOUNTER — Ambulatory Visit
Admission: RE | Admit: 2019-04-09 | Discharge: 2019-04-09 | Disposition: A | Discharge status: Home / Self Care | Payer: BLUE CROSS/BLUE SHIELD | Source: Ambulatory Visit | Attending: Nurse Practitioner | Admitting: Nurse Practitioner

## 2019-04-09 ENCOUNTER — Other Ambulatory Visit: Payer: Self-pay

## 2019-04-09 ENCOUNTER — Inpatient Hospital Stay: Payer: BLUE CROSS/BLUE SHIELD | Attending: Nurse Practitioner | Admitting: Nurse Practitioner

## 2019-04-09 ENCOUNTER — Ambulatory Visit (INDEPENDENT_AMBULATORY_CARE_PROVIDER_SITE_OTHER): Payer: BLUE CROSS/BLUE SHIELD | Admitting: Physician Assistant

## 2019-04-09 ENCOUNTER — Other Ambulatory Visit (HOSPITAL_COMMUNITY)
Admission: RE | Admit: 2019-04-09 | Discharge: 2019-04-09 | Disposition: A | Discharge status: Home / Self Care | Payer: BLUE CROSS/BLUE SHIELD | Source: Ambulatory Visit | Attending: Physician Assistant | Admitting: Physician Assistant

## 2019-04-09 VITALS — BP 134/83 | HR 84 | Temp 98.7°F | Wt 138.8 lb

## 2019-04-09 DIAGNOSIS — R1013 Epigastric pain: Secondary | ICD-10-CM

## 2019-04-09 DIAGNOSIS — Z13220 Encounter for screening for lipoid disorders: Secondary | ICD-10-CM

## 2019-04-09 DIAGNOSIS — Z0000 Encounter for general adult medical examination without abnormal findings: Secondary | ICD-10-CM | POA: Diagnosis not present

## 2019-04-09 DIAGNOSIS — Z124 Encounter for screening for malignant neoplasm of cervix: Secondary | ICD-10-CM | POA: Diagnosis not present

## 2019-04-09 DIAGNOSIS — Z130 Encounter for screening for diseases of the blood and blood-forming organs and certain disorders involving the immune mechanism: Secondary | ICD-10-CM

## 2019-04-09 DIAGNOSIS — Z1159 Encounter for screening for other viral diseases: Secondary | ICD-10-CM | POA: Diagnosis not present

## 2019-04-09 DIAGNOSIS — Z1239 Encounter for other screening for malignant neoplasm of breast: Secondary | ICD-10-CM

## 2019-04-09 DIAGNOSIS — Z1211 Encounter for screening for malignant neoplasm of colon: Secondary | ICD-10-CM

## 2019-04-09 DIAGNOSIS — Z87891 Personal history of nicotine dependence: Secondary | ICD-10-CM

## 2019-04-09 DIAGNOSIS — Z122 Encounter for screening for malignant neoplasm of respiratory organs: Secondary | ICD-10-CM | POA: Insufficient documentation

## 2019-04-09 DIAGNOSIS — Z131 Encounter for screening for diabetes mellitus: Secondary | ICD-10-CM

## 2019-04-09 DIAGNOSIS — Z1329 Encounter for screening for other suspected endocrine disorder: Secondary | ICD-10-CM

## 2019-04-09 DIAGNOSIS — Z114 Encounter for screening for human immunodeficiency virus [HIV]: Secondary | ICD-10-CM | POA: Diagnosis not present

## 2019-04-09 DIAGNOSIS — F1010 Alcohol abuse, uncomplicated: Secondary | ICD-10-CM

## 2019-04-09 NOTE — Progress Notes (Signed)
Patient: Alexa Thompson, Female    DOB: [birth year], 58 y.o.   MRN: [id number] Visit Date: 04/09/2019  Today's Provider: Trinna Post, PA-C   Chief Complaint  Patient presents with  . Annual Exam   Subjective:     Annual physical exam Alexa Thompson is a 58 y.o. female who presents today for health maintenance and complete physical. She feels fairly well. She reports exercising lightly. She reports she is sleeping poorly.  Patient reports cousin and dad died of colon cancer. Reports her cousin required resection and ostomy bag. Denies parent's having colon cancer. She herself has never been screened.  Alcohol Abuse: She is drinking two drinks a night and on occasion she drinks more than 6 drinks. She is undergoing classes for a DWI.  Menoapause: last period mid 70's. She does not have any vaginal bleeding, she is due for a PAP smear today.   Mammogram: 2017 and normal, due today  Lung Cancer Screening: She is undergoing low dose CT scan today. She continues to smoke. She is not yet ready to quit. She has been using Spiriva intermittently because she does not want to run out of it. She feels it may be helping her breathing.   Reports she has gotten a tetanus shot within the past 10 years.  -----------------------------------------------------------------   Review of Systems  Constitutional: Negative.   HENT: Negative.   Eyes: Negative.   Respiratory: Negative.   Cardiovascular: Negative.   Gastrointestinal: Negative.   Endocrine: Negative.   Genitourinary: Negative.   Musculoskeletal: Negative.   Skin: Negative.   Allergic/Immunologic: Negative.   Neurological: Negative.   Hematological: Negative.   Psychiatric/Behavioral: Negative.     Social History      She  reports that she has been smoking cigarettes. She has a 67.50 pack-year smoking history. She has never used smokeless tobacco. She reports current alcohol use of about 4.0 standard drinks of alcohol per  week. She reports current drug use. Drugs: "Crack" cocaine, Marijuana, Benzodiazepines, and Other-see comments.       Social History   Socioeconomic History  . Marital status: Single    Spouse name: Not on file  . Number of children: Not on file  . Years of education: Not on file  . Highest education level: Not on file  Occupational History  . Not on file  Social Needs  . Financial resource strain: Not on file  . Food insecurity:    Worry: Not on file    Inability: Not on file  . Transportation needs:    Medical: Not on file    Non-medical: Not on file  Tobacco Use  . Smoking status: Current Every Day Smoker    Packs/day: 1.50    Years: 45.00    Pack years: 67.50    Types: Cigarettes  . Smokeless tobacco: Never Used  Substance and Sexual Activity  . Alcohol use: Yes    Alcohol/week: 4.0 standard drinks    Types: 4 Cans of beer per week    Comment: previous heavy drinker October, 2016  . Drug use: Yes    Types: "Crack" cocaine, Marijuana, Benzodiazepines, Other-see comments    Comment: recovering. last use of Cocaine was February 01, 2016  . Sexual activity: Not on file  Lifestyle  . Physical activity:    Days per week: Not on file    Minutes per session: Not on file  . Stress: Not on file  Relationships  . Social  connections:    Talks on phone: Not on file    Gets together: Not on file    Attends religious service: Not on file    Active member of club or organization: Not on file    Attends meetings of clubs or organizations: Not on file    Relationship status: Not on file  Other Topics Concern  . Not on file  Social History Narrative  . Not on file    Past Medical History:  Diagnosis Date  . Allergy   . Heart murmur   . Substance abuse [facility])      Patient Active Problem List   Diagnosis Date Noted  . Alcohol abuse 01/20/2019  . Cigarette nicotine dependence without complication 84/69/6295  . History of opioid abuse ([place]) 02/15/2016    Past Surgical  History:  Procedure Laterality Date  . APPENDECTOMY  age 74ish  . CESAREAN SECTION  age 34  . HERNIA REPAIR  age 63ish    Family History        Family Status  Relation Name Status  . Mother  Deceased  . Father  Alive        Her family history includes COPD in her mother; Diabetes in her father; Heart disease in her father; Hypertension in her mother; Kidney disease in her mother.      No Known Allergies   Current Outpatient Medications:  .  Tiotropium Bromide Monohydrate (SPIRIVA RESPIMAT) 2.5 MCG/ACT AERS, Inhale 2 puffs into the lungs daily., Disp: 2 Inhaler, Rfl: 0   Patient Care Team: Paulene Floor as PCP - General (Physician Assistant)    Objective:    Vitals: BP 134/83 (BP Location: Right Arm, Patient Position: Sitting, Cuff Size: Normal)   Pulse 84   Temp 98.7 F (37.1 C) (Oral)   Wt 138 lb 12.8 oz (63 kg)   LMP  (LMP Unknown)   SpO2 98%   BMI 23.46 kg/m    Vitals:   04/09/19 0900  BP: 134/83  Pulse: 84  Temp: 98.7 F (37.1 C)  TempSrc: Oral  SpO2: 98%  Weight: 138 lb 12.8 oz (63 kg)     Physical Exam Constitutional:      Appearance: Normal appearance.  HENT:     Right Ear: Tympanic membrane and ear canal normal.     Left Ear: Tympanic membrane and ear canal normal.  Cardiovascular:     Rate and Rhythm: Normal rate and regular rhythm.     Heart sounds: Normal heart sounds.  Pulmonary:     Effort: Pulmonary effort is normal.     Breath sounds: Normal breath sounds.  Chest:     Breasts: Breasts are symmetrical.        Right: Normal.        Left: Normal.  Abdominal:     General: Bowel sounds are normal.     Palpations: Abdomen is soft.  Skin:    General: Skin is warm and dry.  Neurological:     Mental Status: She is alert and oriented to person, place, and time. Mental status is at baseline.  Psychiatric:        Mood and Affect: Mood normal.        Behavior: Behavior normal.      Depression Screen PHQ 2/9 Scores 04/09/2019  01/20/2019  PHQ - 2 Score 0 2  PHQ- 9 Score 2 9       Assessment & Plan:     Routine Health Maintenance and  Physical Exam  Exercise Activities and Dietary recommendations Goals   None      There is no immunization history on file for this patient.  Health Maintenance  Topic Date Due  . Hepatitis C Screening  November 02, 1961  . HIV Screening  04/09/1976  . TETANUS/TDAP  04/09/1980  . PAP SMEAR-Modifier  04/09/1982  . COLONOSCOPY  04/10/2011  . MAMMOGRAM  02/27/2018  . INFLUENZA VACCINE  06/19/2019     Discussed health benefits of physical activity, and encouraged her to engage in regular exercise appropriate for her age and condition.    1. Annual physical exam   2. Breast cancer screening  - MM Digital Screening; Future  3. Encounter for screening for HIV  - HIV antibody (with reflex)  4. Need for hepatitis C screening test  - Hepatitis C antibody  5. Diabetes mellitus screening  - Comprehensive Metabolic Panel (CMET)  6. Screening for deficiency anemia  - CBC with Differential  7. Thyroid disorder screening  - TSH  8. Screening cholesterol level  - Lipid Profile  9. Cervical cancer screening  - Cytology - PAP  10. Colon cancer screening  - Ambulatory referral to Gastroenterology  11. Alcohol abuse  History of DWI and continues to drink two drinks nicely with occasional binge drinking. Counseled on alcohol abuse, she should not be drinking anything. Should consider AA. Will place referral to CCM for counseling resources and other options to guide her.   12. Epigastric pain  Patient reports she is having issues with an old hernia. I will refer to surgery.  - Ambulatory referral to General Surgery  The entirety of the information documented in the History of Present Illness, Review of Systems and Physical Exam were personally obtained by me. Portions of this information were initially documented by Idelle Jo, CMA and reviewed by me for  thoroughness and accuracy.    F/u 1 year CPE   --------------------------------------------------------------------    Trinna Post, PA-C  [facility]

## 2019-04-09 NOTE — Progress Notes (Signed)
Virtual Visit via Telemedicine Note   I connected with Alexa Thompson on 04/09/19 EST at '@APPTIME'$ @ by video enabled telemedicine visit and verified that I am speaking with the correct person using two identifiers.   I discussed the limitations, risks, security and privacy concerns of performing an evaluation and management service by telemedicine and the availability of in-person appointments. I also discussed with the patient that there may be a patient responsible charge related to this service. The patient expressed understanding and agreed to proceed.   Other persons participating in the visit and their role in the encounter: Burgess Estelle, RN  Patient's location: clinic  Provider's location: home  Chief Complaint: Low Dose CT Screening  Patient agreed to evaluation by telephone/telemedicine to discuss shared decision making for consideration of low dose CT lung cancer screening.    In accordance with CMS guidelines, patient has met eligibility criteria including age, absence of signs or symptoms of lung cancer.  Social History   Tobacco Use  . Smoking status: Current Every Day Smoker    Packs/day: 1.50    Years: 45.00    Pack years: 67.50    Types: Cigarettes  . Smokeless tobacco: Never Used  Substance Use Topics  . Alcohol use: Yes    Alcohol/week: 4.0 standard drinks    Types: 4 Cans of beer per week    Comment: previous heavy drinker October, 2016     A shared decision-making session was conducted prior to the performance of CT scan. This includes one or more decision aids, includes benefits and harms of screening, follow-up diagnostic testing, over-diagnosis, false positive rate, and total radiation exposure.   Counseling on the importance of adherence to annual lung cancer LDCT screening, impact of co-morbidities, and ability or willingness to undergo diagnosis and treatment is imperative for compliance of the program.   Counseling on the importance of continued smoking  cessation for former smokers; the importance of smoking cessation for current smokers, and information about tobacco cessation interventions have been given to patient including [place] and 1800 quit [place] programs.   Written order for lung cancer screening with LDCT has been given to the patient and any and all questions have been answered to the best of my abilities.    Yearly follow up will be coordinated by Burgess Estelle, Thoracic Navigator.  I discussed the assessment and treatment plan with the patient. The patient was provided an opportunity to ask questions and all were answered. The patient agreed with the plan and demonstrated an understanding of the instructions.   The patient was advised to call back or seek an in-person evaluation if the symptoms worsen or if the condition fails to improve as anticipated.   I provided 15 minutes of face-to-face video visit time during this encounter, and > 50% was spent counseling as documented under my assessment & plan.   Beckey Rutter, DNP, AGNP-C [place] at [facility] [phone] (work cell) [phone] (office)

## 2019-04-10 LAB — LIPID PANEL
Chol/HDL Ratio: 3 ratio (ref 0.0–4.4)
Cholesterol, Total: 177 mg/dL (ref 100–199)
HDL: 60 mg/dL (ref >39)
LDL Calculated: 75 mg/dL (ref 0–99)
Triglycerides: 210 mg/dL — ABNORMAL HIGH (ref 0–149)
VLDL Cholesterol Cal: 42 mg/dL — ABNORMAL HIGH (ref 5–40)

## 2019-04-10 LAB — CBC WITH DIFFERENTIAL/PLATELET
Basophils Absolute: 0 10*3/uL (ref 0.0–0.2)
Basos: 1 %
EOS (ABSOLUTE): 0.2 10*3/uL (ref 0.0–0.4)
Eos: 4 %
Hematocrit: 39.4 % (ref 34.0–46.6)
Hemoglobin: 13.8 g/dL (ref 11.1–15.9)
Immature Grans (Abs): 0 10*3/uL (ref 0.0–0.1)
Immature Granulocytes: 0 %
Lymphocytes Absolute: 1.5 10*3/uL (ref 0.7–3.1)
Lymphs: 33 %
MCH: 34.1 pg — ABNORMAL HIGH (ref 26.6–33.0)
MCHC: 35 g/dL (ref 31.5–35.7)
MCV: 97 fL (ref 79–97)
Monocytes Absolute: 0.4 10*3/uL (ref 0.1–0.9)
Monocytes: 8 %
Neutrophils Absolute: 2.6 10*3/uL (ref 1.4–7.0)
Neutrophils: 54 %
Platelets: 134 10*3/uL — ABNORMAL LOW (ref 150–450)
RBC: 4.05 x10E6/uL (ref 3.77–5.28)
RDW: 13.4 % (ref 11.7–15.4)
WBC: 4.7 10*3/uL (ref 3.4–10.8)

## 2019-04-10 LAB — COMPREHENSIVE METABOLIC PANEL
ALT: 13 IU/L (ref 0–32)
AST: 22 IU/L (ref 0–40)
Albumin/Globulin Ratio: 2 (ref 1.2–2.2)
Albumin: 3.6 g/dL — ABNORMAL LOW (ref 3.8–4.9)
Alkaline Phosphatase: 53 IU/L (ref 39–117)
BUN/Creatinine Ratio: 11 (ref 9–23)
BUN: 10 mg/dL (ref 6–24)
Bilirubin Total: 0.3 mg/dL (ref 0.0–1.2)
CO2: 21 mmol/L (ref 20–29)
Calcium: 8.4 mg/dL — ABNORMAL LOW (ref 8.7–10.2)
Chloride: 105 mmol/L (ref 96–106)
Creatinine, Ser: 0.94 mg/dL (ref 0.57–1.00)
GFR calc Af Amer: 78 mL/min/{1.73_m2} (ref >59)
GFR calc non Af Amer: 68 mL/min/{1.73_m2} (ref >59)
Globulin, Total: 1.8 g/dL (ref 1.5–4.5)
Glucose: 108 mg/dL — ABNORMAL HIGH (ref 65–99)
Potassium: 4.1 mmol/L (ref 3.5–5.2)
Sodium: 140 mmol/L (ref 134–144)
Total Protein: 5.4 g/dL — ABNORMAL LOW (ref 6.0–8.5)

## 2019-04-10 LAB — HEPATITIS C ANTIBODY: Hep C Virus Ab: <0.1 s/co ratio (ref 0.0–0.9)

## 2019-04-10 LAB — HIV ANTIBODY (ROUTINE TESTING W REFLEX): HIV Screen 4th Generation wRfx: NONREACTIVE

## 2019-04-10 LAB — TSH: TSH: 3.06 u[IU]/mL (ref 0.450–4.500)

## 2019-04-13 ENCOUNTER — Telehealth: Payer: Self-pay | Admitting: Physician Assistant

## 2019-04-13 ENCOUNTER — Telehealth: Payer: Self-pay | Admitting: *Deleted

## 2019-04-13 DIAGNOSIS — I700 Atherosclerosis of aorta: Secondary | ICD-10-CM

## 2019-04-13 DIAGNOSIS — K869 Disease of pancreas, unspecified: Secondary | ICD-10-CM

## 2019-04-13 DIAGNOSIS — I712 Thoracic aortic aneurysm, without rupture, unspecified: Secondary | ICD-10-CM

## 2019-04-13 MED ORDER — ATORVASTATIN CALCIUM 10 MG PO TABS: 10.0000 mg | ORAL_TABLET | Freq: Every day | ORAL | 0 refills | Status: DC

## 2019-04-13 NOTE — Telephone Encounter (Signed)
JUST FYI:   Have discussed imaging results with patients regarding her low dose CT screening. There is a lesion on her pancreas requiring follow up abdominal CT which I have ordered today. 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 Have advised her to quit smoking, started her on lIpitor 10 mg daily and we will see her in follow up in one month. We will order repeat imaging at that time.

## 2019-04-13 NOTE — Telephone Encounter (Signed)
Hi Shawn, thank you I will take care of it.

## 2019-04-13 NOTE — Telephone Encounter (Signed)
Notified patient of LDCT lung cancer screening program results with recommendation for 12 month follow up imaging. Also notified of incidental findings noted below and is encouraged to discuss further with PCP who will receive a copy of this note and/or the CT report. Patient verbalizes understanding and is aware that her PCP will likely recommend further imaging of her abdomen.  IMPRESSION: 1. Lung-RADS 2, benign appearance or behavior. Continue annual screening with low-dose chest CT without contrast in 12 months. 2. 2.4 cm hypoattenuating focus is seen along the head of the pancreas. This has been incompletely visualized and is visible on the bottom slice of the study. Although likely volume averaging with normal anatomy, dedicated abdomen CT with contrast recommended to confirm. 3. Ascending thoracic aorta measures 4 cm diameter. Recommend annual imaging followup by CTA or MRA. This recommendation follows 2010 ACCF/AHA/AATS/ACR/ASA/SCA/SCAI/SIR/STS/SVM Guidelines for the Diagnosis and Management of Patients with Thoracic Aortic Disease. Circulation. 2010; 121: T625-W389. Aortic aneurysm NOS (ICD10-I71.9) 4. Sequelae of granulomatous disease.

## 2019-04-15 ENCOUNTER — Telehealth: Payer: Self-pay

## 2019-04-15 ENCOUNTER — Other Ambulatory Visit: Payer: Self-pay

## 2019-04-15 DIAGNOSIS — Z1211 Encounter for screening for malignant neoplasm of colon: Secondary | ICD-10-CM

## 2019-04-15 MED ORDER — NA SULFATE-K SULFATE-MG SULF 17.5-3.13-1.6 GM/177ML PO SOLN: 1.0000 | Freq: Once | ORAL | 0 refills | Status: AC

## 2019-04-15 NOTE — Telephone Encounter (Signed)
Kim from [employer] calling that member Alexa Thompson is having a CT abdomen pelvis w contrast and they would like this order to go to  [facility] . Fax: [place] Call back number is  [phone]

## 2019-04-15 NOTE — Telephone Encounter (Signed)
Yes sorry

## 2019-04-15 NOTE — Telephone Encounter (Signed)
Gastroenterology Pre-Procedure Review  Request Date: 05/04/19 Requesting Physician: Dr. Vicente Males  PATIENT REVIEW QUESTIONS: The patient responded to the following health history questions as indicated:    1. Are you having any GI issues? no 2. Do you have a personal history of Polyps? no 3. Do you have a family history of Colon Cancer or Polyps? yes (Uncle and Cousin Colon Cancer) 4. Diabetes Mellitus? no 5. Joint replacements in the past 12 months?no 6. Major health problems in the past 3 months?no 7. Any artificial heart valves, MVP, or defibrillator?no    MEDICATIONS & ALLERGIES:    Patient reports the following regarding taking any anticoagulation/antiplatelet therapy:   Plavix, Coumadin, Eliquis, Xarelto, Lovenox, Pradaxa, Brilinta, or Effient? no Aspirin? no  Patient confirms/reports the following medications:  Current Outpatient Medications  Medication Sig Dispense Refill  . atorvastatin (LIPITOR) 10 MG tablet Take 1 tablet (10 mg total) by mouth daily. 90 tablet 0  . Tiotropium Bromide Monohydrate (SPIRIVA RESPIMAT) 2.5 MCG/ACT AERS Inhale 2 puffs into the lungs daily. 2 Inhaler 0   No current facility-administered medications for this visit.     Patient confirms/reports the following allergies:  No Known Allergies  No orders of the defined types were placed in this encounter.   AUTHORIZATION INFORMATION Primary Insurance: 1D#: Group #:  Secondary Insurance: 1D#: Group #:  SCHEDULE INFORMATION: Date: 05/04/19 Time: Location:[facility]

## 2019-04-15 NOTE — Telephone Encounter (Signed)
Order faxed. KW

## 2019-04-15 NOTE — Telephone Encounter (Signed)
The number listed is the fax number for the order?

## 2019-04-15 NOTE — Telephone Encounter (Signed)
Order signed, placed for fax. Please fax to the number for [employer] listed below.

## 2019-04-16 ENCOUNTER — Telehealth: Payer: Self-pay

## 2019-04-16 NOTE — Telephone Encounter (Signed)
This was taken care by [facility] CMA yesterday

## 2019-04-16 NOTE — Telephone Encounter (Signed)
Pt is scheduled today within an hour and they need to have a new order sent to [facility] fax # [phone] ATTN: nicole and the order was already sent but they need It to have [employer] [facility] on the letterhead instead of [place] on there.  Spoke with kim from [facility].

## 2019-04-16 NOTE — Telephone Encounter (Signed)
I don't understand this message. Sounds like it has something to do with order for abdominal CT that the insurance company needs. Can send them whatever order or documentation they need to satisfy [place].

## 2019-04-19 ENCOUNTER — Ambulatory Visit: Payer: Self-pay

## 2019-04-19 DIAGNOSIS — F1111 Opioid abuse, in remission: Secondary | ICD-10-CM

## 2019-04-19 DIAGNOSIS — K869 Disease of pancreas, unspecified: Secondary | ICD-10-CM | POA: Diagnosis not present

## 2019-04-19 DIAGNOSIS — F1010 Alcohol abuse, uncomplicated: Secondary | ICD-10-CM

## 2019-04-19 NOTE — Chronic Care Management (AMB) (Signed)
  Care Management   Unsuccessful Call Note 04/19/2019 Name: DERYL PORTS MRN: [id number] DOB: Feb 26, 1961  Mariel Craft. Deitrick is a 58 year old female who sees Carles Collet, [place] for primary care. Ms. Terrilee Croak asked the CCM team to consult the patient for care coordination and care management secondary to her need for counseling and counseling resources for Alcohol Abuse Referral was placed 04/09/2019 during last office visit.   Was unable to reach patient via telephone today for introduction to care management services. I have left HIPAA compliant voicemail asking patient to return my call. (unsuccessful outreach #1).   Plan: Will follow-up within 7 business days via telephone.    Vashawn Ekstein E. Rollene Rotunda, RN, BSN Nurse Care Coordinator [facility] Care Management [phone]

## 2019-04-21 ENCOUNTER — Telehealth: Payer: Self-pay

## 2019-04-21 NOTE — Telephone Encounter (Signed)
-----   Message from Trinna Post, [place] sent at 04/21/2019  2:13 PM EDT ----- HPV came back negative which is the virus that typically causes cancer. Her PAP smear came back with "scant cellularity" which means there wasn't enough sample to run the rest of the test. Since her HPV was negative, this is reassuring that she doesn't have any severe abnormality. She can repeat this at her next years physical or she come back at her convenience.

## 2019-04-21 NOTE — Telephone Encounter (Signed)
Patient advised as below.  

## 2019-04-22 ENCOUNTER — Ambulatory Visit (INDEPENDENT_AMBULATORY_CARE_PROVIDER_SITE_OTHER): Payer: BC Managed Care – PPO | Admitting: Surgery

## 2019-04-22 ENCOUNTER — Encounter: Payer: Self-pay | Admitting: Surgery

## 2019-04-22 ENCOUNTER — Telehealth: Payer: Self-pay | Admitting: Physician Assistant

## 2019-04-22 ENCOUNTER — Telehealth: Payer: Self-pay | Admitting: Surgery

## 2019-04-22 ENCOUNTER — Other Ambulatory Visit: Payer: Self-pay

## 2019-04-22 VITALS — BP 132/79 | HR 88 | Temp 97.7°F | Resp 16 | Ht 64.0 in | Wt 138.2 lb

## 2019-04-22 DIAGNOSIS — R1033 Periumbilical pain: Secondary | ICD-10-CM | POA: Diagnosis not present

## 2019-04-22 LAB — CYTOLOGY - PAP: HPV: NOT DETECTED

## 2019-04-22 NOTE — Telephone Encounter (Signed)
Patient called to update regarding results of recent abdominal CT to evaluate for pancreatic lesion. Results were discussed with patient, and she is going to pick up CD/DVD with abdominal CT images from imaging center and will bring to our office for review. We will refer her to GI for endoscopic ultrasound and corresponding lab tests as discussed with patient. The above were also discussed with patient's primary care PA-C, Royetta Crochet, as well.  Patient instructed to call with any questions or concerns.  -- Marilynne Drivers Rosana Hoes, MD, [place]: [facility] - Partnering for exceptional care. Office: [phone]

## 2019-04-22 NOTE — Telephone Encounter (Signed)
FMLA paperwork on my desk for this patient. We never discussed FMLA. If related to possible hernia, then general surgery should complete with restrictions. If not for that, what is the reason? Thanks.

## 2019-04-22 NOTE — Progress Notes (Addendum)
Referral for Alexa Thompson faxed. They will contact the patient to schedule the appointment.  PH: [phone] Fax: [phone]  Patient is scheduled for her Endoscopic ultrasound with Dr Cephas Darby on 05/19/19.

## 2019-04-22 NOTE — Patient Instructions (Signed)
Please call with any questions or concerns. Dr.Davis will call you later once he has reviewed the CT scan.

## 2019-04-22 NOTE — Progress Notes (Signed)
Surgical Clinic History and Physical  Referring provider:  Trinna Post, PA-C [address], [place] [address]  HISTORY OF PRESENT ILLNESS (HPI):  58 y.o. very pleasant female presents for evaluation of abdominal pain. Patient reports she experiences an intermittently painful supra-umbilical bulge, which occurs and is exacerbated by standing, heavy lifting, and coughing associated with chronic smoking and seasonal allergies. She denies any constipation or diarrhea, straining with BM's, blood with BM's, or difficulties with urination. She coughs frequently (no recent changes), associated with chronic smoking and seasonal allergies. She also recalls having experienced similar symptoms 30+ years ago, for which a Psychologist, sport and exercise in [place] diagnosed an umbilical hernia and told her he repaired her hernia in his office with local anesthetic and an oral valium. Following surgery, she says her symptoms resolved until they appear to have recurred over the past 4 - 5 years. She says the bulge always reduces without her pushing on it, and she denies any abdominal distention, N/V, fever/chills, CP, or SOB. Of note, patient recently underwent abdominal CT at [facility] to follow up possible pancreatic lesion visualized on low radiation dose chest CT to screen for lung cancer. Patient also states she no longer uses cocaine, but recognizes the importance of smoking and alcohol cessation, says she plans and would like to stop both.  PAST MEDICAL HISTORY (PMH):  Past Medical History:  Diagnosis Date  . Allergy   . Heart murmur   . Substance abuse ([place])     PAST SURGICAL HISTORY ([facility]):  Past Surgical History:  Procedure Laterality Date  . APPENDECTOMY  age 24ish  . CESAREAN SECTION  age 62  . HERNIA REPAIR  age 20ish    MEDICATIONS:  Prior to Admission medications   Medication Sig Start Date End Date Taking? Authorizing Provider  atorvastatin (LIPITOR) 10 MG tablet Take 1  tablet (10 mg total) by mouth daily. 04/13/19  Yes [name], PA-C  Tiotropium Bromide Monohydrate (SPIRIVA RESPIMAT) 2.5 MCG/ACT AERS Inhale 2 puffs into the lungs daily. 02/11/19  Yes Trinna Post, PA-C    ALLERGIES:  No Known Allergies   SOCIAL HISTORY:  Social History   Socioeconomic History  . Marital status: Single    Spouse name: Not on file  . Number of children: Not on file  . Years of education: Not on file  . Highest education level: Not on file  Occupational History  . Not on file  Social Needs  . Financial resource strain: Not on file  . Food insecurity:    Worry: Not on file    Inability: Not on file  . Transportation needs:    Medical: Not on file    Non-medical: Not on file  Tobacco Use  . Smoking status: Current Every Day Smoker    Packs/day: 1.50    Years: 45.00    Pack years: 67.50    Types: Cigarettes  . Smokeless tobacco: Never Used  Substance and Sexual Activity  . Alcohol use: Yes    Alcohol/week: 4.0 standard drinks    Types: 4 Cans of beer per week    Comment: previous heavy drinker October, 2016  . Drug use: Yes    Types: "Crack" cocaine, Marijuana, Benzodiazepines, Other-see comments    Comment: recovering. last use of Cocaine was February 01, 2016  . Sexual activity: Not on file  Lifestyle  . Physical activity:    Days per week: Not on file    Minutes per session: Not on  file  . Stress: Not on file  Relationships  . Social connections:    Talks on phone: Not on file    Gets together: Not on file    Attends religious service: Not on file    Active member of club or organization: Not on file    Attends meetings of clubs or organizations: Not on file    Relationship status: Not on file  . Intimate partner violence:    Fear of current or ex partner: Not on file    Emotionally abused: Not on file    Physically abused: Not on file    Forced sexual activity: Not on file  Other Topics Concern  . Not on file  Social History  Narrative  . Not on file    The patient currently resides (home / rehab facility / nursing home): Home The patient normally is (ambulatory / bedbound): Ambulatory  FAMILY HISTORY:  Family History  Problem Relation Age of Onset  . Kidney disease Mother   . COPD Mother   . Hypertension Mother   . Diabetes Father   . Heart disease Father        CHF    Otherwise negative/non-contributory.  REVIEW OF SYSTEMS:  Constitutional: denies any other weight loss, fever, chills, or sweats  Eyes: denies any other vision changes, history of eye injury  ENT: denies sore throat, hearing problems  Respiratory: denies shortness of breath, wheezing  Cardiovascular: denies chest pain, palpitations  Gastrointestinal: abdominal pain, N/V, and bowel function as per HPI Musculoskeletal: denies any other joint pains or cramps  Skin: Denies any other rashes or skin discolorations except as per HPI Neurological: denies any other headache, dizziness, weakness  Psychiatric: Denies any other depression, anxiety   All other review of systems were otherwise negative   VITAL SIGNS:  BP 132/79   Pulse 88   Temp 97.7 F (36.5 C) (Temporal)   Resp 16   Ht 5\' 4"  (1.626 m)   Wt 138 lb 3.2 oz (62.7 kg)   LMP  (LMP Unknown)   SpO2 92%   BMI 23.72 kg/m   PHYSICAL EXAM:  Constitutional:  -- Normal body habitus  -- Awake, alert, and oriented x3  Eyes:  -- Pupils equally round and reactive to light  -- No scleral icterus  Ear, nose, throat:  -- No jugular venous distension -- No nasal drainage, bleeding Pulmonary:  -- No crackles  -- Equal breath sounds bilaterally -- Breathing non-labored at rest Cardiovascular:  -- S1, S2 present  -- No pericardial rubs  Gastrointestinal: -- Abdomen soft and non-distended with minimal focal supra-umbilical tenderness to palpation at site of former well-healed 2 cm transverse post-surgical incision site, otherwise completely non-tender to palpation at epigastrium  or otherwise, no guarding/rebound tenderness -- No abdominal masses appreciated, pulsatile or otherwise, specifically no hernia appreciated with or without provocation (coughing, valsalva, or sitting from reclined position) Musculoskeletal and Integumentary:  -- Wounds or skin discoloration: None appreciated except as described above (GI) -- Extremities: B/L UE and LE FROM, hands and feet warm, no edema  Neurologic:  -- Motor function: Intact and symmetric -- Sensation: Intact and symmetric  Labs:  CBC Latest Ref Rng & Units 04/09/2019 02/15/2016 12/08/2013  WBC 3.4 - 10.8 x10E3/uL 4.7 5.0 5.5  Hemoglobin 11.1 - 15.9 g/dL 13.8 13.4 12.4  Hematocrit 34.0 - 46.6 % 39.4 38.7 36.7  Platelets 150 - 450 x10E3/uL 134(L) 194 174    CMP Latest Ref Rng & Units 04/09/2019  02/15/2016 12/08/2013  Glucose 65 - 99 mg/dL 108(H) 91 88  BUN 6 - 24 mg/dL 10 14 14   Creatinine 0.57 - 1.00 mg/dL 0.94 0.82 0.85  Sodium 134 - 144 mmol/L 140 140 140  Potassium 3.5 - 5.2 mmol/L 4.1 4.5 3.9  Chloride 96 - 106 mmol/L 105 105 105  CO2 20 - 29 mmol/L 21 26 30   Calcium 8.7 - 10.2 mg/dL 8.4(L) 9.0 9.2  Total Protein 6.0 - 8.5 g/dL 5.4(L) 6.9 7.9  Total Bilirubin 0.0 - 1.2 mg/dL 0.3 0.4 0.3  Alkaline Phos 39 - 117 IU/L 53 66 71  AST 0 - 40 IU/L 22 18 21   ALT 0 - 32 IU/L 13 10 29    Imaging studies:  CT Chest (5/22) personally reviewed, but does not include supra-umbilical abdomen  Abdominal CT not currently available in Epic or Care Everywhere, will request interpretation and images for personal review  Assessment/Plan: 58 y.o. female with symptoms consistent with recurrent supra-umbilical hernia s/p what sounds like primary repair without mesh, though in-office repair would currently be considered atypical, though no hernia is able to be appreciated on exam during evaluation today, complicated by pertinent comorbidities including seasonal allergies, former polysubstance abuse, and chronic ongoing abuse of alcohol and  tobacco (smoking).    - will obtain and review recent abdominal CT and update patient accordingly             - discussed with patient signs and symptoms of hernia incarceration and obstruction             - strategies for manual self-reduction of patient's hernia also reviewed and discussed  - maintain hydration with high fiber heart healthy diet to reduce/minimize constipation +/- daily stool softener as needed             - all risks, benefits, and alternatives to open vs laparoscopic repair of possibly recurrent supra-umbilical hernia with mesh were discussed with the patient, all of her questions were answered to patient's expressed satisfaction, patient expresses she wishes to proceed if hernia is confirmed, and informed consent was obtained accordingly.             - patient applauded for some healthier lifestyle changes with cessation of smoking and alcohol strongly encouraged and discussed             - further management pending review of patient's recent abdominal CT imaging             - instructed to call if any questions or concerns  All of the above recommendations were discussed with the patient, and all of patient's questions were answered to her expressed satisfaction.  Thank you for the opportunity to participate in this patient's care.  -- Marilynne Drivers Rosana Hoes, MD, [place]: [facility] - Partnering for exceptional care. Office: [phone]

## 2019-04-23 ENCOUNTER — Telehealth: Payer: Self-pay | Admitting: Physician Assistant

## 2019-04-23 NOTE — Telephone Encounter (Signed)
FMLA paperwork completed and placed for fax.

## 2019-04-23 NOTE — Telephone Encounter (Signed)
Form faxed. KW

## 2019-04-23 NOTE — Telephone Encounter (Signed)
Patient states that she already spoke with Adriana who is filling out forms. KW

## 2019-04-27 ENCOUNTER — Telehealth: Payer: Self-pay

## 2019-04-27 NOTE — Telephone Encounter (Signed)
Spoke with pt and was able to schedule her office visit with Dr. Vicente Males on 05-05-19 at 3:30. And also rescheduled pt colonoscopy to 05-11-19.

## 2019-04-27 NOTE — Telephone Encounter (Signed)
Called pt to inform her that per Dr. Vicente Males, pt needs to see him in the office prior to her colonoscopy procedure. Called pt to offer office visit next week. And to reschedule upcoming colonoscopy.  Unable to contact, LVM to return call

## 2019-04-28 ENCOUNTER — Ambulatory Visit: Payer: BLUE CROSS/BLUE SHIELD

## 2019-04-28 ENCOUNTER — Ambulatory Visit: Payer: Self-pay

## 2019-04-28 DIAGNOSIS — F1010 Alcohol abuse, uncomplicated: Secondary | ICD-10-CM

## 2019-04-28 DIAGNOSIS — F1111 Opioid abuse, in remission: Secondary | ICD-10-CM

## 2019-04-28 NOTE — Chronic Care Management (AMB) (Signed)
  Care Management   Unsuccessful Call Note 04/19/2019 Name: Alexa Thompson MRN: [id number]       DOB: August 28, 1961  Alexa Thompson is a 58 year old femalewho sees Carles Collet, [place] for primary care. [name] the CCM team to consult the patient for care coordination and care management secondary to her need for counseling and counseling resources for Alcohol Abuse Referral was placed5/22/2020 during last office visit.  Was unable to reach patient via telephone today forintroduction to care management services. Ihave left HIPAA compliant voicemail asking patient to return my call. (unsuccessful outreach #2).  Plan: Will follow-up within 7business days via telephone.   Norvell Ureste E. Rollene Rotunda, RN, BSN Nurse Care Coordinator [facility] Care Management [phone]

## 2019-05-03 ENCOUNTER — Encounter: Payer: Self-pay | Admitting: *Deleted

## 2019-05-03 ENCOUNTER — Ambulatory Visit: Payer: Self-pay | Admitting: *Deleted

## 2019-05-03 DIAGNOSIS — F1111 Opioid abuse, in remission: Secondary | ICD-10-CM

## 2019-05-03 DIAGNOSIS — F1010 Alcohol abuse, uncomplicated: Secondary | ICD-10-CM

## 2019-05-04 NOTE — Patient Instructions (Signed)
Thank you allowing the Chronic Care Management Team to be a part of your care! It was a pleasure speaking with you today!  1. Please call this social worker with any additional questions or concerns regarding available community resources.   CCM (Chronic Care Management) Team   Trish Fountain RN, BSN Nurse Care Coordinator  [phone]  Ruben Reason PharmD  Clinical Pharmacist  [phone]   [place], LCSW Clinical Social Worker [phone]  Goals Addressed            This Visit's Progress   . " I want to manage my depression and alcohol use better" (pt-stated)       Current Barriers:  Marland Kitchen Mental Health Concerns  . Substance abuse issues - alcoholism  Clinical Social Work Clinical Goal(s):  Marland Kitchen Over the next 30 days, client will work with SW to address concerns related to her depression and alcohol use  Interventions: . Patient interviewed and appropriate assessments performed . Provided mental health counseling with regard to depression and alcoholism (mental health diagnosis or concern) . Discussed plans with patient for ongoing care management follow up and provided patient with direct contact information for care management team  Patient Self Care Activities:  . Attends all scheduled provider appointments . Performs ADL's independently . Performs IADL's independently  Initial goal documentation         The patient verbalized understanding of instructions provided today and declined a print copy of patient instruction materials.   The care management team will reach out to the patient again over the next 14 days.

## 2019-05-04 NOTE — Chronic Care Management (AMB) (Signed)
  Care Management    05/04/2019 Name: Alexa Thompson MRN: [id number] DOB: 11/13/1961  Referred by: Trinna Post, PA-C Reason for referral : Chronic Care Management (counseling)   Alexa Thompson is a 58 y.o. year old female who is a primary care patient of Trinna Post, [place]. The care management team was consulted for assistance with mental health counseling and resources.  Social Determinants of Health screening completed identifying patient being at high risk due to tobacco use and depression. Per patient, tobacco use discussed during her annual exam. Patient referred to CCM for counseling guidance.  Review of patient status, including review of consultants reports, relevant laboratory and other test results, and collaboration with appropriate care team members and the patient's provider was performed as part of comprehensive patient evaluation and provision of care management services.     Goals Addressed            This Visit's Progress   . " I want to manage my depression and alcohol use better" (pt-stated)       Current Barriers:  Marland Kitchen Mental Health Concerns  . Substance abuse issues - alcoholism  Clinical Social Work Clinical Goal(s):  Marland Kitchen Over the next 30 days, client will work with SW to address concerns related to her depression and alcohol use  Interventions: . Patient interviewed and appropriate assessments performed . Provided mental health counseling with regard to depression and alcoholism (mental health diagnosis or concern) . Discussed plans with patient for ongoing care management follow up and provided patient with direct contact information for care management team  Patient Self Care Activities:  . Attends all scheduled provider appointments . Performs ADL's independently . Performs IADL's independently  Initial goal documentation          Ms. Ruland was given information about Care Management services today including:  1. Care Management services  includes personalized support from designated clinical staff supervised by her physician, including individualized plan of care and coordination with other care providers 2. 24/7 contact phone numbers for assistance for urgent and routine care needs. 3. The patient may stop case management services at any time by phone call to the office staff.  Patient agreed to services and verbal consent obtained.    Follow up plan: The care management team will reach out to the patient again over the next 14 days.    Elliot Gurney, [place] Worker  [facility] Care Management [phone]

## 2019-05-05 ENCOUNTER — Encounter: Payer: Self-pay | Admitting: Gastroenterology

## 2019-05-05 ENCOUNTER — Ambulatory Visit (INDEPENDENT_AMBULATORY_CARE_PROVIDER_SITE_OTHER): Payer: BC Managed Care – PPO | Admitting: Gastroenterology

## 2019-05-05 ENCOUNTER — Other Ambulatory Visit: Payer: Self-pay

## 2019-05-05 ENCOUNTER — Ambulatory Visit: Payer: BC Managed Care – PPO | Admitting: Gastroenterology

## 2019-05-05 ENCOUNTER — Telehealth: Payer: Self-pay

## 2019-05-05 VITALS — BP 136/83 | HR 68 | Temp 98.4°F | Ht 64.0 in | Wt 140.4 lb

## 2019-05-05 DIAGNOSIS — Z1211 Encounter for screening for malignant neoplasm of colon: Secondary | ICD-10-CM | POA: Diagnosis not present

## 2019-05-05 DIAGNOSIS — R1033 Periumbilical pain: Secondary | ICD-10-CM

## 2019-05-05 DIAGNOSIS — R109 Unspecified abdominal pain: Secondary | ICD-10-CM | POA: Diagnosis not present

## 2019-05-05 NOTE — Telephone Encounter (Signed)
Pt would like you to review her recent visit with GI (Dr. Vicente Males) and Surgery (Dr. Rosana Hoes).  Pt thought she had a hernia but Dr. Rosana Hoes did not feel it, however Dr. Vicente Males stated he felt a hernia.  Pt is wanting your opinion if she should go back to Dr. Rosana Hoes or see a different surgeon.     Contact number:  [phone]    Please advise.   Thanks,

## 2019-05-05 NOTE — Progress Notes (Signed)
Jonathon Bellows MD, MRCP(U.K) [address]  [place]  [place], [place] [address]  Main: [phone]  Fax: [phone]   Gastroenterology Consultation  Referring Provider:     Paulene Floor Primary Care Physician:  Paulene Floor Primary Gastroenterologist:  Dr. Jonathon Bellows  Reason for Consultation:     Colonoscopy and abnormal CT scan         HPI:   Alexa Thompson is a 58 y.o. y/o female referred for consultation & management  by Dr. Terrilee Croak, [name], PA-C.    Recently she underwent a CT scan of the abdomen for abdominal pain on 04/09/2019 and a lung rads 2 lesion was seen and in addition a 2.4 cm hypoattenuating focus seen in the head of the pancreas- further evaluation suggested. 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   She is to have a hernia repair with Dr Rosana Hoes.   She has had the CT scan for abdominal pain for over a year. Center , tender, sometimes eating makes it worse and when she coughs , localized, She takes occasional ibuprofen , describes the pain as a squeeze. No weight loss. No family history of pancreatitis or cancer. Cousin and uncle had colon cancer. No first degree relatives with colon cancer.   She says no blood in her stools or change in shape of her stools - occasionally is small in size.   She has been a smoker for over 30 years on and off. Last used opiates and heroine 5 years back .    Past Medical History:  Diagnosis Date   Allergy    Heart murmur    Substance abuse [facility])     Past Surgical History:  Procedure Laterality Date   APPENDECTOMY  age 37ish   CESAREAN SECTION  age 75   HERNIA REPAIR  age 20ish    Prior to Admission medications   Medication Sig Start Date End Date Taking? Authorizing Provider  atorvastatin (LIPITOR) 10 MG tablet Take 1 tablet (10 mg total) by mouth daily. 04/13/19   Trinna Post, PA-C  Tiotropium Bromide Monohydrate (SPIRIVA RESPIMAT) 2.5 MCG/ACT AERS Inhale 2 puffs into the lungs daily.  02/11/19   Trinna Post, PA-C    Family History  Problem Relation Age of Onset   Kidney disease Mother    COPD Mother    Hypertension Mother    Diabetes Father    Heart disease Father        CHF     Social History   Tobacco Use   Smoking status: Current Every Day Smoker    Packs/day: 1.50    Years: 45.00    Pack years: 67.50    Types: Cigarettes   Smokeless tobacco: Never Used  Substance Use Topics   Alcohol use: Yes    Alcohol/week: 4.0 standard drinks    Types: 4 Cans of beer per week    Comment: previous heavy drinker October, 2016   Drug use: Yes    Types: "Crack" cocaine, Marijuana, Benzodiazepines, Other-see comments    Comment: recovering. last use of Cocaine was February 01, 2016    Allergies as of 05/05/2019   (No Known Allergies)    Review of Systems:    All systems reviewed and negative except where noted in HPI.   Physical Exam:  LMP  (LMP Unknown)  No LMP recorded (lmp unknown). Patient is postmenopausal. Psych:  Alert and cooperative. Normal mood and affect. General:   Alert,  Well-developed, well-nourished, pleasant and cooperative in NAD Head:  Normocephalic and atraumatic. Eyes:  Sclera clear, no icterus.   Conjunctiva pink. Ears:  Normal auditory acuity. Nose:  No deformity, discharge, or lesions. Mouth:  No deformity or lesions,oropharynx pink & moist. Neck:  Supple; no masses or thyromegaly. Lungs:  Respirations even and unlabored.  Clear throughout to auscultation.   No wheezes, crackles, or rhonchi. No acute distress. Heart:  Regular rate and rhythm; no murmurs, clicks, rubs, or gallops. Abdomen:  Normal bowel sounds.  No bruits.  Soft, mild peri umbilical tenderness and midline defect in muscle and a bulge felt on a deepcough  and non-distended without masses, hepatosplenomegaly   No guarding or rebound tenderness.    Neurologic:  Alert and oriented x3;  grossly normal neurologically. Skin:  Intact without significant lesions or  rashes. No jaundice. Lymph Nodes:  No significant cervical adenopathy. Psych:  Alert and cooperative. Normal mood and affect.  Imaging Studies: Ct Chest Lung Cancer Screening Low Dose Wo Contrast  Result Date: 04/09/2019 CLINICAL DATA:  58 year old female with 60 pack-year history of smoking. Lung cancer screening. EXAM: CT CHEST WITHOUT CONTRAST LOW-DOSE FOR LUNG CANCER SCREENING TECHNIQUE: Multidetector CT imaging of the chest was performed following the standard protocol without IV contrast. COMPARISON:  None. FINDINGS: Cardiovascular: The heart size is normal. No substantial pericardial effusion. Calcified lymph nodes are seen in the mediastinum and hilar regions. Atherosclerotic calcification is noted in the wall of the thoracic aorta. Ascending thoracic aorta measures 4 cm diameter. Mediastinum/Nodes: Calcified nodal tissue is seen in the mediastinum and both hilar regions. The esophagus has normal imaging features. There is no axillary lymphadenopathy. Lungs/Pleura: The central tracheobronchial airways are patent. Calcified granulomata are scattered in the parenchyma of both lungs. 2.4 mm noncalcified nodule identified anterior right upper lobe. No suspicious nodule or mass. No pleural effusion. Upper Abdomen: 2.4 cm focus of hypo attenuation is seen along the head of the pancreas. This has been incompletely visualized. Musculoskeletal: No worrisome lytic or sclerotic osseous abnormality. IMPRESSION: 1. Lung-RADS 2, benign appearance or behavior. Continue annual screening with low-dose chest CT without contrast in 12 months. 2. 2.4 cm hypoattenuating focus is seen along the head of the pancreas. This has been incompletely visualized and is visible on the bottom slice of the study. Although likely volume averaging with normal anatomy, dedicated abdomen CT with contrast recommended to confirm. 3. Ascending thoracic aorta measures 4 cm diameter. Recommend annual imaging followup by CTA or MRA. This  recommendation follows 2010 ACCF/AHA/AATS/ACR/ASA/SCA/SCAI/SIR/STS/SVM Guidelines for the Diagnosis and Management of Patients with Thoracic Aortic Disease. Circulation. 2010; 121: Y606-T016. Aortic aneurysm NOS (ICD10-I71.9) 4. Sequelae of granulomatous disease. Electronically Signed   By: Misty Stanley M.D.   On: 04/09/2019 16:19    Assessment and Plan:   Alexa Thompson is a 58 y.o. y/o female has been referred for a colonoscopy and abnormal CT scan of the abdomen which shows an abnormal appearing area in the head of the pancreas.   Plan  1. Colonoscopy for colon cancer screening and EGD for abdominal pain 2. F/u EUS which she has an appointment at [facility] for the pancreatic lesion  3. Stop smoking  4. H pylori breath test today   I have discussed alternative options, risks & benefits,  which include, but are not limited to, bleeding, infection, perforation,respiratory complication & drug reaction.  The patient agrees with this plan & written consent will be obtained.    Follow up in 6 weeks  Dr Jonathon Bellows MD,MRCP(U.K)

## 2019-05-06 LAB — H. PYLORI BREATH TEST: H pylori Breath Test: NEGATIVE

## 2019-05-07 ENCOUNTER — Other Ambulatory Visit: Payer: Self-pay

## 2019-05-07 ENCOUNTER — Other Ambulatory Visit
Admission: RE | Admit: 2019-05-07 | Discharge: 2019-05-07 | Disposition: A | Discharge status: Home / Self Care | Payer: BC Managed Care – PPO | Source: Ambulatory Visit | Attending: Gastroenterology | Admitting: Gastroenterology

## 2019-05-07 DIAGNOSIS — Z1159 Encounter for screening for other viral diseases: Secondary | ICD-10-CM | POA: Insufficient documentation

## 2019-05-08 LAB — NOVEL CORONAVIRUS, NAA (HOSP ORDER, SEND-OUT TO REF LAB; TAT 18-24 HRS): SARS-CoV-2, NAA: NOT DETECTED

## 2019-05-10 ENCOUNTER — Encounter: Payer: Self-pay | Admitting: Gastroenterology

## 2019-05-11 ENCOUNTER — Ambulatory Visit
Admission: RE | Admit: 2019-05-11 | Discharge: 2019-05-11 | Disposition: A | Discharge status: Home / Self Care | Payer: BC Managed Care – PPO | Attending: Gastroenterology | Admitting: Gastroenterology

## 2019-05-11 ENCOUNTER — Ambulatory Visit: Payer: BC Managed Care – PPO | Admitting: Anesthesiology

## 2019-05-11 ENCOUNTER — Other Ambulatory Visit: Payer: Self-pay

## 2019-05-11 ENCOUNTER — Encounter
Admission: RE | Disposition: A | Discharge status: Home / Self Care | Payer: Self-pay | Source: Home / Self Care | Attending: Gastroenterology

## 2019-05-11 ENCOUNTER — Encounter: Payer: Self-pay | Admitting: *Deleted

## 2019-05-11 DIAGNOSIS — J449 Chronic obstructive pulmonary disease, unspecified: Secondary | ICD-10-CM | POA: Diagnosis not present

## 2019-05-11 DIAGNOSIS — D126 Benign neoplasm of colon, unspecified: Secondary | ICD-10-CM | POA: Diagnosis not present

## 2019-05-11 DIAGNOSIS — K649 Unspecified hemorrhoids: Secondary | ICD-10-CM | POA: Diagnosis not present

## 2019-05-11 DIAGNOSIS — R109 Unspecified abdominal pain: Secondary | ICD-10-CM | POA: Diagnosis not present

## 2019-05-11 DIAGNOSIS — K640 First degree hemorrhoids: Secondary | ICD-10-CM | POA: Diagnosis not present

## 2019-05-11 DIAGNOSIS — Z79899 Other long term (current) drug therapy: Secondary | ICD-10-CM | POA: Insufficient documentation

## 2019-05-11 DIAGNOSIS — Z1211 Encounter for screening for malignant neoplasm of colon: Secondary | ICD-10-CM | POA: Diagnosis not present

## 2019-05-11 DIAGNOSIS — K635 Polyp of colon: Secondary | ICD-10-CM | POA: Diagnosis not present

## 2019-05-11 DIAGNOSIS — D125 Benign neoplasm of sigmoid colon: Secondary | ICD-10-CM | POA: Insufficient documentation

## 2019-05-11 DIAGNOSIS — K2970 Gastritis, unspecified, without bleeding: Secondary | ICD-10-CM | POA: Insufficient documentation

## 2019-05-11 DIAGNOSIS — K2960 Other gastritis without bleeding: Secondary | ICD-10-CM | POA: Diagnosis not present

## 2019-05-11 DIAGNOSIS — F17210 Nicotine dependence, cigarettes, uncomplicated: Secondary | ICD-10-CM | POA: Diagnosis not present

## 2019-05-11 HISTORY — PX: COLONOSCOPY WITH PROPOFOL: SHX5780

## 2019-05-11 HISTORY — PX: ESOPHAGOGASTRODUODENOSCOPY (EGD) WITH PROPOFOL: SHX5813

## 2019-05-11 SURGERY — COLONOSCOPY WITH PROPOFOL
Anesthesia: General

## 2019-05-11 MED ORDER — PROPOFOL 500 MG/50ML IV EMUL
INTRAVENOUS | Status: AC
  Filled 2019-05-11: qty 50

## 2019-05-11 MED ORDER — SODIUM CHLORIDE 0.9 % IV SOLN
INTRAVENOUS | Status: DC
  Administered 2019-05-11: 08:00:00 via INTRAVENOUS

## 2019-05-11 MED ORDER — LIDOCAINE HCL (CARDIAC) PF 100 MG/5ML IV SOSY
PREFILLED_SYRINGE | INTRAVENOUS | Status: DC | PRN
  Administered 2019-05-11: 50 mg via INTRAVENOUS

## 2019-05-11 MED ORDER — PROPOFOL 500 MG/50ML IV EMUL
INTRAVENOUS | Status: DC | PRN
  Administered 2019-05-11: 190 ug/kg/min via INTRAVENOUS

## 2019-05-11 MED ORDER — PROPOFOL 10 MG/ML IV BOLUS
INTRAVENOUS | Status: DC | PRN
  Administered 2019-05-11: 50 mg via INTRAVENOUS

## 2019-05-11 NOTE — Op Note (Signed)
[facility] Gastroenterology Patient Name: Alexa Thompson Procedure Date: 05/11/2019 8:30 AM MRN: [id number] Account #: [id number] Date of Birth: 21-Mar-1961 Admit Type: Outpatient Age: 58 Room: [facility] ENDO ROOM 3 Gender: Female Note Status: Finalized Procedure:            Colonoscopy Indications:          Screening for colorectal malignant neoplasm Providers:            Jonathon Bellows MD, MD Medicines:            Monitored Anesthesia Care Complications:        No immediate complications. Procedure:            Pre-Anesthesia Assessment:                       - Prior to the procedure, a History and Physical was                        performed, and patient medications, allergies and                        sensitivities were reviewed. The patient's tolerance of                        previous anesthesia was reviewed.                       - The risks and benefits of the procedure and the                        sedation options and risks were discussed with the                        patient. All questions were answered and informed                        consent was obtained.                       - ASA Grade Assessment: III - A patient with severe                        systemic disease.                       After obtaining informed consent, the colonoscope was                        passed under direct vision. Throughout the procedure,                        the patient's blood pressure, pulse, and oxygen                        saturations were monitored continuously. The                        Colonoscope was introduced through the anus and                        advanced to the the cecum, identified by the  appendiceal orifice, IC valve and transillumination.                        The colonoscopy was performed with ease. The patient                        tolerated the procedure well. The quality of the bowel                        preparation was  excellent. Findings:      The perianal and digital rectal examinations were normal.      Non-bleeding internal hemorrhoids were found during retroflexion. The       hemorrhoids were medium-sized and Grade I (internal hemorrhoids that do       not prolapse).      A 10 mm polyp was found in the sigmoid colon. The polyp was       pedunculated. The polyp was removed with a hot snare. Resection and       retrieval were complete. To prevent bleeding after the polypectomy, one       hemostatic clip was successfully placed. There was no bleeding at the       end of the procedure.      The exam was otherwise without abnormality on direct and retroflexion       views. Impression:           - Non-bleeding internal hemorrhoids.                       - One 10 mm polyp in the sigmoid colon, removed with a                        hot snare. Resected and retrieved. Clip was placed.                       - The examination was otherwise normal on direct and                        retroflexion views. Recommendation:       - Discharge patient to home (with escort).                       - Resume previous diet.                       - Continue present medications.                       - Await pathology results.                       - Repeat colonoscopy for surveillance based on                        pathology results.                       - Return to GI office as previously scheduled. Procedure Code(s):    --- Professional ---                       [phone], Colonoscopy, flexible; with removal of tumor(s),  polyp(s), or other lesion(s) by snare technique Diagnosis Code(s):    --- Professional ---                       Z12.11, Encounter for screening for malignant neoplasm                        of colon                       K63.5, Polyp of colon                       K64.0, First degree hemorrhoids CPT copyright 2019 American Medical Association. All rights reserved. The codes  documented in this report are preliminary and upon coder review may  be revised to meet current compliance requirements. Jonathon Bellows, MD Jonathon Bellows MD, MD 05/11/2019 9:02:48 AM This report has been signed electronically. Number of Addenda: 0 Note Initiated On: 05/11/2019 8:30 AM Total Procedure Duration: 0 hours 14 minutes 14 seconds  Estimated Blood Loss: Estimated blood loss: none.      [facility]

## 2019-05-11 NOTE — Transfer of Care (Signed)
Immediate Anesthesia Transfer of Care Note  Patient: Alexa Thompson  Procedure(s) Performed: COLONOSCOPY WITH PROPOFOL (N/A ) ESOPHAGOGASTRODUODENOSCOPY (EGD) WITH PROPOFOL (N/A )  Patient Location: PACU  Anesthesia Type:General  Level of Consciousness: awake, alert  and oriented  Airway & Oxygen Therapy: Patient Spontanous Breathing and Patient connected to nasal cannula oxygen  Post-op Assessment: Report given to RN and Post -op Vital signs reviewed and stable  Post vital signs: Reviewed and stable  Last Vitals:  Vitals Value Taken Time  BP    Temp    Pulse    Resp    SpO2      Last Pain:  Vitals:   05/11/19 0805  TempSrc: Tympanic  PainSc: 0-No pain      Patients Stated Pain Goal: 0 (25/74/93 5521)  Complications: No apparent anesthesia complications

## 2019-05-11 NOTE — Anesthesia Post-op Follow-up Note (Signed)
Anesthesia QCDR form completed.        

## 2019-05-11 NOTE — Anesthesia Preprocedure Evaluation (Addendum)
Anesthesia Evaluation  Patient identified by MRN, date of birth, ID band Patient awake    Reviewed: Allergy & Precautions, H&P , NPO status , Patient's Chart, lab work & pertinent test results  Airway Mallampati: I  TM Distance: >3 FB     Dental  (+) Upper Dentures   Pulmonary COPD, Current Smoker,           Cardiovascular negative cardio ROS       Neuro/Psych negative neurological ROS     GI/Hepatic negative GI ROS, (+)     substance abuse  alcohol use,   Endo/Other  negative endocrine ROS  Renal/GU negative Renal ROS  negative genitourinary   Musculoskeletal  (+) narcotic dependent  Abdominal   Peds  Hematology negative hematology ROS (+)   Anesthesia Other Findings Past Medical History: No date: Allergy No date: Heart murmur No date: Substance abuse [facility])  Past Surgical History: age 49ish: APPENDECTOMY age 73: CESAREAN SECTION age 55ish: HERNIA REPAIR  BMI    Body Mass Index: 24.03 kg/m      Reproductive/Obstetrics negative OB ROS                           Anesthesia Physical Anesthesia Plan  ASA: II  Anesthesia Plan: General   Post-op Pain Management:    Induction:   PONV Risk Score and Plan: Propofol infusion and TIVA  Airway Management Planned: Natural Airway and Nasal Cannula  Additional Equipment:   Intra-op Plan:   Post-operative Plan:   Informed Consent: I have reviewed the patients History and Physical, chart, labs and discussed the procedure including the risks, benefits and alternatives for the proposed anesthesia with the patient or authorized representative who has indicated his/her understanding and acceptance.     Dental Advisory Given  Plan Discussed with: Anesthesiologist and CRNA  Anesthesia Plan Comments:         Anesthesia Quick Evaluation

## 2019-05-11 NOTE — H&P (Signed)
Alexa Bellows, MD [address], [place], [place], [place], [address] [address], [place], [place], [place], [address] Phone: [phone]  Fax: [phone]  Primary Care Physician:  Trinna Post, PA-C   Pre-Procedure History & Physical: HPI:  Alexa Thompson is a 58 y.o. female is here for an endoscopy and colonoscopy    Past Medical History:  Diagnosis Date   Allergy    Heart murmur    Substance abuse [facility])     Past Surgical History:  Procedure Laterality Date   APPENDECTOMY  age 84ish   CESAREAN SECTION  age 19   [place]  age 20ish    Prior to Admission medications   Medication Sig Start Date End Date Taking? Authorizing Provider  atorvastatin (LIPITOR) 10 MG tablet Take 1 tablet (10 mg total) by mouth daily. 04/13/19  Yes [name], PA-C  Tiotropium Bromide Monohydrate (SPIRIVA RESPIMAT) 2.5 MCG/ACT AERS Inhale 2 puffs into the lungs daily. 02/11/19  Yes Trinna Post, PA-C    Allergies as of 04/15/2019   (No Known Allergies)    Family History  Problem Relation Age of Onset   Kidney disease Mother    COPD Mother    Hypertension Mother    Diabetes Father    Heart disease Father        CHF    Social History   Socioeconomic History   Marital status: Single    Spouse name: Not on file   Number of children: Not on file   Years of education: Not on file   Highest education level: Not on file  Occupational History   Not on file  Social Needs   Financial resource strain: Not on file   Food insecurity    Worry: Not on file    Inability: Not on file   Transportation needs    Medical: Not on file    Non-medical: Not on file  Tobacco Use   Smoking status: Current Every Day Smoker    Packs/day: 1.50    Years: 45.00    Pack years: 67.50    Types: Cigarettes   Smokeless tobacco: Never Used  Substance and Sexual Activity   Alcohol use: Yes    Alcohol/week: 4.0 standard drinks    Types: 4 Cans of beer per  week    Comment: previous heavy drinker October, 2016   Drug use: Yes    Types: "Crack" cocaine, Marijuana, Benzodiazepines, Other-see comments    Comment: recovering. last use of Cocaine was February 01, 2016   Sexual activity: Not Currently  Lifestyle   Physical activity    Days per week: 4 days    Minutes per session: 60 min   Stress: Not at all  Relationships   Social connections    Talks on phone: More than three times a week    Gets together: Three times a week    Attends religious service: 1 to 4 times per year    Active member of club or organization: No    Attends meetings of clubs or organizations: Never    Relationship status: Divorced   Intimate partner violence    Fear of current or ex partner: No    Emotionally abused: No    Physically abused: No    Forced sexual activity: No  Other Topics Concern   Not on file  Social History Narrative   Not on file    Review of Systems: See HPI, otherwise negative ROS  Physical Exam: Pulse 91    Temp (!) 97 F (36.1 C) (Tympanic)    Resp 20    Ht 5\' 4"  (1.626 m)    Wt 63.5 kg    LMP  (LMP Unknown)    SpO2 99%    BMI 24.03 kg/m  General:   Alert,  pleasant and cooperative in NAD Head:  Normocephalic and atraumatic. Neck:  Supple; no masses or thyromegaly. Lungs:  Clear throughout to auscultation, normal respiratory effort.    Heart:  +S1, +S2, Regular rate and rhythm, No edema. Abdomen:  Soft, nontender and nondistended. Normal bowel sounds, without guarding, and without rebound.   Neurologic:  Alert and  oriented x4;  grossly normal neurologically.  Impression/Plan: Alexa Thompson is here for an endoscopy and colonoscopy  to be performed for  evaluation of abdominal pain and colon cancer screening     Risks, benefits, limitations, and alternatives regarding endoscopy have been reviewed with the patient.  Questions have been answered.  All parties agreeable.   Alexa Bellows, MD  05/11/2019, 8:31 AM

## 2019-05-11 NOTE — Op Note (Signed)
[facility] Gastroenterology Patient Name: Alexa Thompson Procedure Date: 05/11/2019 8:30 AM MRN: [id number] Account #: [id number] Date of Birth: [birth year] Admit Type: Outpatient Age: 58 Room: [facility] ENDO ROOM 3 Gender: Female Note Status: Finalized Procedure:            Upper GI endoscopy Indications:          Abdominal pain Providers:            Jonathon Bellows MD, MD Medicines:            Monitored Anesthesia Care Complications:        No immediate complications. Procedure:            Pre-Anesthesia Assessment:                       - Prior to the procedure, a History and Physical was                        performed, and patient medications, allergies and                        sensitivities were reviewed. The patient's tolerance of                        previous anesthesia was reviewed.                       - The risks and benefits of the procedure and the                        sedation options and risks were discussed with the                        patient. All questions were answered and informed                        consent was obtained.                       - ASA Grade Assessment: II - A patient with mild                        systemic disease.                       After obtaining informed consent, the endoscope was                        passed under direct vision. Throughout the procedure,                        the patient's blood pressure, pulse, and oxygen                        saturations were monitored continuously. The Endoscope                        was introduced through the mouth, and advanced to the                        third part of duodenum. The upper GI endoscopy was  accomplished with ease. The patient tolerated the                        procedure well. Findings:      The examined duodenum was normal.      The esophagus was normal.      Diffuse mild inflammation characterized by congestion (edema) and   erythema was found in the entire examined stomach. Biopsies were taken       with a cold forceps for histology.      The cardia and gastric fundus were normal on retroflexion. Impression:           - Normal examined duodenum.                       - Normal esophagus.                       - Gastritis. Biopsied. Recommendation:       - Await pathology results.                       - Perform a colonoscopy today. Procedure Code(s):    --- Professional ---                       [phone], Esophagogastroduodenoscopy, flexible, transoral;                        with biopsy, single or multiple Diagnosis Code(s):    --- Professional ---                       K29.70, Gastritis, unspecified, without bleeding                       R10.9, Unspecified abdominal pain CPT copyright 2019 American Medical Association. All rights reserved. The codes documented in this report are preliminary and upon coder review may  be revised to meet current compliance requirements. Jonathon Bellows, MD Jonathon Bellows MD, MD 05/11/2019 8:44:28 AM This report has been signed electronically. Number of Addenda: 0 Note Initiated On: 05/11/2019 8:30 AM Estimated Blood Loss: Estimated blood loss: none.      [facility]

## 2019-05-12 ENCOUNTER — Encounter: Payer: Self-pay | Admitting: Gastroenterology

## 2019-05-12 LAB — SURGICAL PATHOLOGY

## 2019-05-12 NOTE — Anesthesia Postprocedure Evaluation (Signed)
Anesthesia Post Note  Patient: Alexa Thompson  Procedure(s) Performed: COLONOSCOPY WITH PROPOFOL (N/A ) ESOPHAGOGASTRODUODENOSCOPY (EGD) WITH PROPOFOL (N/A )  Patient location during evaluation: PACU Anesthesia Type: General Level of consciousness: awake and alert Pain management: pain level controlled Vital Signs Assessment: post-procedure vital signs reviewed and stable Respiratory status: spontaneous breathing, nonlabored ventilation and respiratory function stable Cardiovascular status: blood pressure returned to baseline and stable Postop Assessment: no apparent nausea or vomiting Anesthetic complications: no     Last Vitals:  Vitals:   05/11/19 0920 05/11/19 0930  BP: (!) 150/88 (!) 150/88  Pulse: 68 76  Resp: (!) 21 19  Temp:    SpO2: 97% 98%    Last Pain:  Vitals:   05/12/19 0752  TempSrc:   PainSc: 0-No pain                 Durenda Hurt

## 2019-05-13 ENCOUNTER — Encounter: Payer: Self-pay | Admitting: Gastroenterology

## 2019-05-16 DIAGNOSIS — Z1159 Encounter for screening for other viral diseases: Secondary | ICD-10-CM | POA: Diagnosis not present

## 2019-05-16 DIAGNOSIS — Z01812 Encounter for preprocedural laboratory examination: Secondary | ICD-10-CM | POA: Diagnosis not present

## 2019-05-19 ENCOUNTER — Ambulatory Visit: Payer: Self-pay | Admitting: *Deleted

## 2019-05-19 ENCOUNTER — Encounter: Payer: Self-pay | Admitting: Gastroenterology

## 2019-05-19 DIAGNOSIS — I898 Other specified noninfective disorders of lymphatic vessels and lymph nodes: Secondary | ICD-10-CM | POA: Diagnosis not present

## 2019-05-19 DIAGNOSIS — K760 Fatty (change of) liver, not elsewhere classified: Secondary | ICD-10-CM | POA: Diagnosis not present

## 2019-05-19 DIAGNOSIS — K2950 Unspecified chronic gastritis without bleeding: Secondary | ICD-10-CM | POA: Diagnosis not present

## 2019-05-19 DIAGNOSIS — F1111 Opioid abuse, in remission: Secondary | ICD-10-CM

## 2019-05-19 DIAGNOSIS — Z79899 Other long term (current) drug therapy: Secondary | ICD-10-CM | POA: Diagnosis not present

## 2019-05-19 DIAGNOSIS — K8689 Other specified diseases of pancreas: Secondary | ICD-10-CM | POA: Diagnosis not present

## 2019-05-19 DIAGNOSIS — K319 Disease of stomach and duodenum, unspecified: Secondary | ICD-10-CM | POA: Diagnosis not present

## 2019-05-19 DIAGNOSIS — K3189 Other diseases of stomach and duodenum: Secondary | ICD-10-CM | POA: Diagnosis not present

## 2019-05-19 DIAGNOSIS — I899 Noninfective disorder of lymphatic vessels and lymph nodes, unspecified: Secondary | ICD-10-CM | POA: Diagnosis not present

## 2019-05-19 DIAGNOSIS — R933 Abnormal findings on diagnostic imaging of other parts of digestive tract: Secondary | ICD-10-CM | POA: Diagnosis not present

## 2019-05-19 DIAGNOSIS — F1010 Alcohol abuse, uncomplicated: Secondary | ICD-10-CM

## 2019-05-19 DIAGNOSIS — F17210 Nicotine dependence, cigarettes, uncomplicated: Secondary | ICD-10-CM | POA: Diagnosis not present

## 2019-05-19 LAB — CHG GI ENDOSCOPIC US S&I

## 2019-05-20 ENCOUNTER — Telehealth: Payer: Self-pay

## 2019-05-20 NOTE — Chronic Care Management (AMB) (Signed)
   Care Management    Clinical Social Work Follow Up Note  05/20/2019 Name: Alexa Thompson MRN: [id number] DOB: 1961/03/13  Alexa Thompson is a 58 y.o. year old female who is a primary care patient of Alexa Thompson, [place]. The CCM team was consulted for assistance with Mental Health Counseling and Resources.   Review of patient status, including review of consultants reports, other relevant assessments, and collaboration with appropriate care team members and the patient's provider was performed as part of comprehensive patient evaluation and provision of chronic care management services.     Goals Addressed            This Visit's Progress   . " I want to manage my depression and alcohol use better" (pt-stated)       Current Barriers:  Marland Kitchen Mental Health Concerns  . Substance abuse issues - alcoholism  Clinical Social Work Clinical Goal(s):  Marland Kitchen Over the next 30 days, client will work with SW to address concerns related to her depression and alcohol use  Interventions: . Patient interviewed and appropriate assessments performed . Provided mental health counseling with regard to depression and alcoholism (mental health diagnosis or concern) . Discussed recent medical procedure and potential follow up . Explored patient's support network and encouraged continued self care practices . Explored patient's history of substance use and plan for cessation . Discussed plans with patient for ongoing care management follow up and provided patient with direct contact information for care management team .   Patient Self Care Activities:  . Attends all scheduled provider appointments . Performs ADL's independently . Performs IADL's independently  Initial goal documentation         Follow Up Plan: SW will follow up with patient by phone over the next 2 weeks    Guadalupe Guerra, [place] Worker  [facility] [phone]

## 2019-05-20 NOTE — Patient Instructions (Addendum)
Thank you allowing the Chronic Care Management Team to be a part of your care! It was a pleasure speaking with you today!  1. Please continue to utilize desired self care strategies  2. Please call this social worker with any questions or concerns regarding your mental health   CCM (Chronic Care Management) Team   Trish Fountain RN, BSN Nurse Care Coordinator  [phone]  Ruben Reason PharmD  Clinical Pharmacist  [phone]   [place], LCSW Clinical Social Worker [phone]  Goals Addressed            This Visit's Progress   . " I want to manage my depression and alcohol use better" (pt-stated)       Current Barriers:  Marland Kitchen Mental Health Concerns  . Substance abuse issues - alcoholism  Clinical Social Work Clinical Goal(s):  Marland Kitchen Over the next 30 days, client will work with SW to address concerns related to her depression and alcohol use  Interventions: . Patient interviewed and appropriate assessments performed . Provided mental health counseling with regard to depression and alcoholism (mental health diagnosis or concern) . Discussed recent medical procedure and potential follow up . Explored patient's support network and encouraged continued self care practices . Explored patient's history of substance use and plan for cessation . Discussed plans with patient for ongoing care management follow up and provided patient with direct contact information for care management team .   Patient Self Care Activities:  . Attends all scheduled provider appointments . Performs ADL's independently . Performs IADL's independently  Initial goal documentation         The patient verbalized understanding of instructions provided today and declined a print copy of patient instruction materials.   Telephone follow up appointment with care management team member scheduled for:06/02/19

## 2019-05-27 NOTE — Telephone Encounter (Signed)
Returned call, no answer. Left instructions for patient to message me through [place].

## 2019-05-31 DIAGNOSIS — K760 Fatty (change of) liver, not elsewhere classified: Secondary | ICD-10-CM | POA: Diagnosis not present

## 2019-06-01 ENCOUNTER — Ambulatory Visit: Payer: BC Managed Care – PPO

## 2019-06-02 ENCOUNTER — Ambulatory Visit: Payer: Self-pay | Admitting: *Deleted

## 2019-06-02 ENCOUNTER — Telehealth: Payer: Self-pay | Admitting: *Deleted

## 2019-06-02 DIAGNOSIS — F1010 Alcohol abuse, uncomplicated: Secondary | ICD-10-CM

## 2019-06-02 DIAGNOSIS — F1111 Opioid abuse, in remission: Secondary | ICD-10-CM

## 2019-06-02 NOTE — Chronic Care Management (AMB) (Signed)
   Chronic Care Management   Unsuccessful Call Note 06/02/2019 Name: [name] MRN: [id number] DOB: [birth year]  Patient is a 58 year old femlae who sees Carles Collet, [place] for primary care. Carles Collet, PA-C asked the CCM team to consult the patient for Mental Health Counseling and resources.   This social worker was unable to reach patient via telephone today for scheduled follow up appointment. I have left HIPAA compliant voicemail asking patient to return my call. (unsuccessful outreach #1).   Plan: Will follow-up within 7 business days via telephone.      Elliot Gurney, [place] Worker  [facility] Management [phone]

## 2019-06-08 ENCOUNTER — Ambulatory Visit: Payer: Self-pay | Admitting: *Deleted

## 2019-06-08 DIAGNOSIS — F1111 Opioid abuse, in remission: Secondary | ICD-10-CM

## 2019-06-08 DIAGNOSIS — F1010 Alcohol abuse, uncomplicated: Secondary | ICD-10-CM

## 2019-06-08 NOTE — Patient Instructions (Signed)
Thank you allowing the Chronic Care Management Team to be a part of your care! It was a pleasure speaking with you today!  1. Please call this social worker with any questions or concerns regarding your mental health needs.  CCM (Chronic Care Management) Team   Trish Fountain RN, BSN Nurse Care Coordinator  [phone]  Ruben Reason PharmD  Clinical Pharmacist  [phone]   [place], LCSW Clinical Social Worker [phone]  Goals Addressed            This Visit's Progress   . " I want to manage my depression and alcohol use better" (pt-stated)       Current Barriers:  Marland Kitchen Mental Health Concerns  . Substance abuse issues - alcoholism  Clinical Social Work Clinical Goal(s):  Marland Kitchen Over the next 30 days, client will work with SW to address concerns related to her depression and alcohol use  Interventions: . Patient interviewed and appropriate assessments performed . Provided mental health counseling with regard to anxiety and stress related to work environment and possible COVID exposures . Allowed patient to vent her frustrations, normalized her fears and explored possible coping strategies . Continued to encourage self care practices(wearing mask, washing hands regularly, keeping safe distances from others) . Discussed plans with patient for ongoing care management follow up and provided patient with direct contact information for care management team .   Patient Self Care Activities:  . Attends all scheduled provider appointments . Performs ADL's independently . Performs IADL's independently  Please see past updates related to this goal by clicking on the "Past Updates" button in the selected goal          The patient verbalized understanding of instructions provided today and declined a print copy of patient instruction materials.   Telephone follow up appointment with care management team member scheduled for:06/16/19

## 2019-06-08 NOTE — Chronic Care Management (AMB) (Signed)
   Care Management    Clinical Social Work Follow Up Note  06/08/2019 Name: Alexa Thompson MRN: [id number] DOB: Sep 02, 1961  Alexa Thompson is a 58 y.o. year old female who is a primary care patient of Trinna Post, [place]. The CCM team was consulted for assistance with Mental Health Counseling and Resources.   Review of patient status, including review of consultants reports, other relevant assessments, and collaboration with appropriate care team members and the patient's provider was performed as part of comprehensive patient evaluation and provision of chronic care management services.     Goals Addressed            This Visit's Progress   . " I want to manage my depression and alcohol use better" (pt-stated)       Current Barriers:  Marland Kitchen Mental Health Concerns  . Substance abuse issues - alcoholism  Clinical Social Work Clinical Goal(s):  Marland Kitchen Over the next 30 days, client will work with SW to address concerns related to her depression and alcohol use  Interventions: . Patient interviewed and appropriate assessments performed . Provided mental health counseling with regard to anxiety and stress related to work environment and possible COVID exposures . Allowed patient to vent her frustrations, normalized her fears and explored possible coping strategies . Continued to encourage self care practices(wearing mask, washing hands regularly, keeping safe distances from others) . Discussed plans with patient for ongoing care management follow up and provided patient with direct contact information for care management team .   Patient Self Care Activities:  . Attends all scheduled provider appointments . Performs ADL's independently . Performs IADL's independently  Please see past updates related to this goal by clicking on the "Past Updates" button in the selected goal          Follow Up Plan: Appointment scheduled for SW follow up with client by phone on: 06/16/19    Elliot Gurney, [place] Worker  [facility] Care Management [phone]

## 2019-06-09 ENCOUNTER — Telehealth: Payer: Self-pay

## 2019-06-16 ENCOUNTER — Ambulatory Visit: Payer: Self-pay | Admitting: *Deleted

## 2019-06-16 DIAGNOSIS — F1111 Opioid abuse, in remission: Secondary | ICD-10-CM

## 2019-06-17 ENCOUNTER — Ambulatory Visit (INDEPENDENT_AMBULATORY_CARE_PROVIDER_SITE_OTHER): Payer: BC Managed Care – PPO | Admitting: Gastroenterology

## 2019-06-17 ENCOUNTER — Encounter: Payer: Self-pay | Admitting: Gastroenterology

## 2019-06-17 ENCOUNTER — Other Ambulatory Visit: Payer: Self-pay

## 2019-06-17 VITALS — BP 160/95 | HR 76 | Temp 98.4°F | Ht 64.0 in | Wt 140.4 lb

## 2019-06-17 DIAGNOSIS — R109 Unspecified abdominal pain: Secondary | ICD-10-CM

## 2019-06-17 DIAGNOSIS — R935 Abnormal findings on diagnostic imaging of other abdominal regions, including retroperitoneum: Secondary | ICD-10-CM

## 2019-06-17 NOTE — Progress Notes (Signed)
Alexa Bellows MD, MRCP(U.K) [address]  [place]  [place], [place] [address]  Main: [phone]  Fax: [phone]   Primary Care Physician: Trinna Post, PA-C  Primary Gastroenterologist:  Dr. Jonathon Thompson   Follow-up for abnormal appearing pancreas seen on recent CT scan of the abdomen.  HPI: Alexa Thompson is a 58 y.o. female    Summary of history :  She was initially seen and referred on 05/05/2019.She underwent a CT scan of the abdomen for abdominal pain on 04/09/2019 . A lung rads 2 lesion was seen and in addition a 2.4 cm hypoattenuating focus seen in the head of the pancreas- further evaluation suggested. 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    She has had the CT scan for abdominal pain for over a year. Center , tender, sometimes eating makes it worse and when she coughs , localized, She takes occasional ibuprofen , describes the pain as a squeeze. No weight loss. No family history of pancreatitis or cancer. Cousin and uncle had colon cancer. No first degree relatives with colon cancer. She has been a smoker for over 30 years on and off. 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   Interval history   05/05/2019-06/17/2019 05/05/2019: H. pylori breath test: Negative 05/11/2019: EGD: Normal.  Colonoscopy: 10 mm polyp resected in the sigmoid colon and internal hemorrhoids were noted.  Biopsies from the stomach showed reactive hyperplasia.  Polyp demonstrated tubulovillous adenoma and the stalk was free of any dysplasia.  05/19/2019 she underwent EUS with Dr. Robin Searing there is no significant abnormality in the pancreatic head.  PD measured 1.5 mm.  No masses visualized.  Abnormality in the pancreatic parenchyma were noted in the neck of the pancreas pancreatic body tail and uncinate process which was described as diffuse hyperechogenic echogenicity.  Common bile duct  measured 3.2 mm.  1 benign-appearing lymph node appeared in the porta hepatis region.  Last note by [facility] on 06/08/2019 suggest that with plans for his repeat CT scan to reassess the lesion had a pancreas.  She is doing well since her last visit.  Only issue is occasional periumbilical discomfort.  Current Outpatient Medications  Medication Sig Dispense Refill  . atorvastatin (LIPITOR) 10 MG tablet Take 1 tablet (10 mg total) by mouth daily. 90 tablet 0  . Tiotropium Bromide Monohydrate (SPIRIVA RESPIMAT) 2.5 MCG/ACT AERS Inhale 2 puffs into the lungs daily. 2 Inhaler 0   No current facility-administered medications for this visit.     Allergies as of 06/17/2019  . (No Known Allergies)    ROS:  General: Negative for anorexia, weight loss, fever, chills, fatigue, weakness. ENT: Negative for hoarseness, difficulty swallowing , nasal congestion. CV: Negative for chest pain, angina, palpitations, dyspnea on exertion, peripheral edema.  Respiratory: Negative for dyspnea at rest, dyspnea on exertion, cough, sputum, wheezing.  GI: See history of present illness. GU:  Negative for dysuria, hematuria, urinary incontinence, urinary frequency, nocturnal urination.  Endo: Negative for unusual weight change.    Physical Examination:   LMP  (LMP Unknown)   General: Well-nourished, well-developed in no acute distress.  Eyes: No icterus. Conjunctivae pink. Mouth: Oropharyngeal mucosa moist and pink , no lesions erythema or exudate. Lungs: Clear to auscultation bilaterally. Non-labored. Heart: Regular rate and rhythm, no murmurs rubs or gallops.  Abdomen: Bowel sounds are normal, mild  tenderness in the supraumbilical area with minimal fullness felt when she tightens her abdominal muscles.  Nondistended, no hepatosplenomegaly or masses, no abdominal bruits or hernia , no rebound or guarding.   Extremities: No lower extremity edema. No clubbing or deformities. Neuro: Alert and oriented x 3.   Grossly intact. Skin: Warm and dry, no jaundice.   Psych: Alert and cooperative, normal mood and affect.   Imaging Studies: No results found.  Assessment and Plan:   Alexa Thompson is a 58 y.o. y/o female here to follow-up for aabnormal CT scan of the abdomen which shows an abnormal appearing area in the head of the pancreas.  Subsequent EUS showed no abnormality.  She is being followed by Dr. Robin Searing.  As per note from [place] [facility] on 06/08/2019 the plan is to repeat a CT scan of the abdomen to review the abnormal appearing area of the pancreas as no abnormality was seen on the EUS.  Plan  1. Colonoscopy  repeat in 3 years for surveillance 2.  Avoid any NSAIDs, continue PPI. 3.  Trial of IBgard for the abdominal discomfort.  On examination there is a bit of tenderness in the periumbilical area with possibly a weakness in the abdominal muscles and bulging of underlying contents.  This is the area of tenderness.  She also mentions the same area that had a prior hernia repair.  I suspect either it is a defect in the repair or scar tissue.  Dr Alexa Bellows  MD,MRCP [facility]) Follow up in 8 weeks

## 2019-06-17 NOTE — Patient Instructions (Signed)
Thank you allowing the Chronic Care Management Team to be a part of your care! It was a pleasure speaking with you today!  1. Please continue to utilize your self care strategies to address anxiety and stress. 2. Please call this social worker with any questions or concerns regarding your mental health needs.  CCM (Chronic Care Management) Team   Trish Fountain RN, BSN Nurse Care Coordinator  [phone]  Ruben Reason PharmD  Clinical Pharmacist  [phone]   [place], LCSW Clinical Social Worker [phone]  Goals Addressed            This Visit's Progress   . " I want to manage my depression and alcohol use better" (pt-stated)       Current Barriers:  Marland Kitchen Mental Health Concerns  . Substance abuse issues - alcoholism  Clinical Social Work Clinical Goal(s):  Marland Kitchen Over the next 90 days, client will work with SW to address concerns related to her depression and alcohol use  Interventions: . Patient interviewed and appropriate assessments performed . Continued to provide mental health counseling with regard to anxiety and stress related to work environment and possible COVID exposures . Allowed patient to vent her frustrations, normalized her fears and explored possible coping strategies . Continued to encourage self care practices and personal responsibilities (wearing mask, washing hands regularly, keeping safe distances from others) . Discussed plans with patient for ongoing care management follow up and provided patient with direct contact information for care management team .   Patient Self Care Activities:  . Attends all scheduled provider appointments . Performs ADL's independently . Performs IADL's independently  Please see past updates related to this goal by clicking on the "Past Updates" button in the selected goal          The patient verbalized understanding of instructions provided today and declined a print copy of patient instruction  materials.   The care management team will reach out to the patient again over the next 14 days.

## 2019-06-17 NOTE — Chronic Care Management (AMB) (Signed)
  Care Management    Clinical Social Work Follow Up Note  06/17/2019 Name: Alexa Thompson MRN: [id number] DOB: [birth year]  Alexa Thompson is a 58 y.o. year old female who is a primary care patient of Trinna Post, [place]. The CCM team was consulted for assistance with Mental Health Counseling and Resources.   Review of patient status, including review of consultants reports, other relevant assessments, and collaboration with appropriate care team members and the patient's provider was performed as part of comprehensive patient evaluation and provision of chronic care management services.     Goals Addressed            This Visit's Progress   . " I want to manage my depression and alcohol use better" (pt-stated)       Current Barriers:  Marland Kitchen Mental Health Concerns  . Substance abuse issues - alcoholism  Clinical Social Work Clinical Goal(s):  Marland Kitchen Over the next 90 days, client will work with SW to address concerns related to her depression and alcohol use  Interventions: . Patient interviewed and appropriate assessments performed . Continued to provide mental health counseling with regard to anxiety and stress related to work environment and possible COVID exposures . Allowed patient to vent her frustrations, normalized her fears and explored possible coping strategies . Continued to encourage self care practices and personal responsibilities (wearing mask, washing hands regularly, keeping safe distances from others) . Discussed plans with patient for ongoing care management follow up and provided patient with direct contact information for care management team .   Patient Self Care Activities:  . Attends all scheduled provider appointments . Performs ADL's independently . Performs IADL's independently  Please see past updates related to this goal by clicking on the "Past Updates" button in the selected goal          Follow Up Plan: SW will follow up with patient by phone over the  next 2 weeks    [place], [place] Worker  [employer] [facility] [phone]

## 2019-06-21 DIAGNOSIS — E279 Disorder of adrenal gland, unspecified: Secondary | ICD-10-CM | POA: Diagnosis not present

## 2019-06-21 DIAGNOSIS — K8689 Other specified diseases of pancreas: Secondary | ICD-10-CM | POA: Diagnosis not present

## 2019-07-05 ENCOUNTER — Ambulatory Visit
Admission: RE | Admit: 2019-07-05 | Discharge: 2019-07-05 | Disposition: A | Discharge status: Home / Self Care | Payer: BC Managed Care – PPO | Source: Ambulatory Visit | Attending: Physician Assistant | Admitting: Physician Assistant

## 2019-07-05 DIAGNOSIS — Z1231 Encounter for screening mammogram for malignant neoplasm of breast: Secondary | ICD-10-CM | POA: Insufficient documentation

## 2019-07-05 DIAGNOSIS — R928 Other abnormal and inconclusive findings on diagnostic imaging of breast: Secondary | ICD-10-CM | POA: Insufficient documentation

## 2019-07-05 DIAGNOSIS — Z1239 Encounter for other screening for malignant neoplasm of breast: Secondary | ICD-10-CM

## 2019-07-06 ENCOUNTER — Other Ambulatory Visit: Payer: Self-pay | Admitting: Physician Assistant

## 2019-07-06 DIAGNOSIS — R928 Other abnormal and inconclusive findings on diagnostic imaging of breast: Secondary | ICD-10-CM

## 2019-07-06 DIAGNOSIS — N6310 Unspecified lump in the right breast, unspecified quadrant: Secondary | ICD-10-CM

## 2019-07-08 ENCOUNTER — Encounter: Payer: Self-pay | Admitting: *Deleted

## 2019-07-09 ENCOUNTER — Encounter: Payer: Self-pay | Admitting: *Deleted

## 2019-07-09 ENCOUNTER — Ambulatory Visit: Payer: Self-pay | Admitting: *Deleted

## 2019-07-09 ENCOUNTER — Telehealth: Payer: Self-pay | Admitting: *Deleted

## 2019-07-09 NOTE — Chronic Care Management (AMB) (Signed)
   Chronic Care Management   Unsuccessful Call Note 07/09/2019 Name: Alexa Thompson MRN: [id number] DOB: November 26, 1960  Patient  is a 58 year old female who sees Carles Collet, [place] for primary care. Carles Collet, PA-C asked the CCM team to consult the patient for counseling and mental health resources.   This social worker was unable to reach patient via telephone today for follow up appointment. I have left HIPAA compliant voicemail asking patient to return my call. (unsuccessful outreach #1).   Plan: Will follow-up within 7 business days via telephone.      Elliot Gurney, [place] Worker  [facility] [phone]

## 2019-07-09 NOTE — Progress Notes (Signed)
This encounter was created in error - please disregard.

## 2019-07-10 ENCOUNTER — Other Ambulatory Visit: Payer: Self-pay | Admitting: Physician Assistant

## 2019-07-10 DIAGNOSIS — I712 Thoracic aortic aneurysm, without rupture, unspecified: Secondary | ICD-10-CM

## 2019-07-10 DIAGNOSIS — I700 Atherosclerosis of aorta: Secondary | ICD-10-CM

## 2019-07-14 ENCOUNTER — Telehealth: Payer: Self-pay

## 2019-07-15 ENCOUNTER — Telehealth: Payer: Self-pay | Admitting: *Deleted

## 2019-07-15 ENCOUNTER — Ambulatory Visit: Payer: Self-pay | Admitting: *Deleted

## 2019-07-15 DIAGNOSIS — Z20828 Contact with and (suspected) exposure to other viral communicable diseases: Secondary | ICD-10-CM | POA: Diagnosis not present

## 2019-07-15 DIAGNOSIS — B349 Viral infection, unspecified: Secondary | ICD-10-CM | POA: Diagnosis not present

## 2019-07-15 NOTE — Chronic Care Management (AMB) (Signed)
    Care Management   Unsuccessful Call Note 07/15/2019 Name: Alexa Thompson MRN: [id number] DOB: 08/20/1961  Patient  is a 58 year old femalewho sees Carles Collet, [place] for primary care. Carles Collet, PA-C asked the CCM team to consult the patient for counseling and mental health resources.  This social worker was unable to reach patient via telephone today forfollow up appointment. Ihave left HIPAA compliant voicemail asking patient to return my call. (unsuccessful outreach #2).  Plan: Will follow-up within 7business days via telephone.      Elliot Gurney, [place] Worker  [facility] [phone]

## 2019-07-16 ENCOUNTER — Ambulatory Visit: Payer: BC Managed Care – PPO

## 2019-07-16 ENCOUNTER — Other Ambulatory Visit: Payer: BC Managed Care – PPO

## 2019-07-21 ENCOUNTER — Ambulatory Visit: Payer: Self-pay | Admitting: *Deleted

## 2019-07-21 ENCOUNTER — Telehealth: Payer: Self-pay | Admitting: *Deleted

## 2019-07-21 NOTE — Chronic Care Management (AMB) (Signed)
   Chronic Care Management   Unsuccessful Call Note 07/21/2019 Name: Alexa Thompson MRN: [id number] DOB: 08-16-1961  Patientis a 58year old femalewho seesAdriana [place], PA-Cfor primary care. Alexa Collet, PA-Casked the CCM team to consult the patient forcounseling and mental health resources.  This social worker was unable to reach patient via telephone today forfollow up appointment. Ihave left HIPAA compliant voicemail asking patient to return my call. (unsuccessful outreach #3).  Plan:  This Education officer, museum will make to additional attempts to reach patient due to difficulty in maintaining contact.  This social worker will be happy to engage patient upon her return call.    Elliot Gurney, [place] Worker  [facility] [phone]

## 2019-07-23 ENCOUNTER — Other Ambulatory Visit: Payer: BC Managed Care – PPO

## 2019-08-02 ENCOUNTER — Ambulatory Visit
Admission: RE | Admit: 2019-08-02 | Discharge: 2019-08-02 | Disposition: A | Discharge status: Home / Self Care | Payer: BC Managed Care – PPO | Source: Ambulatory Visit | Attending: Physician Assistant | Admitting: Physician Assistant

## 2019-08-02 DIAGNOSIS — N6310 Unspecified lump in the right breast, unspecified quadrant: Secondary | ICD-10-CM

## 2019-08-02 DIAGNOSIS — R928 Other abnormal and inconclusive findings on diagnostic imaging of breast: Secondary | ICD-10-CM

## 2019-08-02 DIAGNOSIS — R922 Inconclusive mammogram: Secondary | ICD-10-CM | POA: Diagnosis not present

## 2019-08-12 ENCOUNTER — Other Ambulatory Visit: Payer: Self-pay

## 2019-08-12 ENCOUNTER — Ambulatory Visit (INDEPENDENT_AMBULATORY_CARE_PROVIDER_SITE_OTHER): Payer: BC Managed Care – PPO | Admitting: Gastroenterology

## 2019-08-12 ENCOUNTER — Encounter: Payer: Self-pay | Admitting: Gastroenterology

## 2019-08-12 VITALS — BP 155/90 | HR 77 | Temp 97.8°F | Ht 64.0 in | Wt 144.2 lb

## 2019-08-12 DIAGNOSIS — R109 Unspecified abdominal pain: Secondary | ICD-10-CM

## 2019-08-12 DIAGNOSIS — R935 Abnormal findings on diagnostic imaging of other abdominal regions, including retroperitoneum: Secondary | ICD-10-CM

## 2019-08-12 NOTE — Progress Notes (Signed)
Jonathon Bellows MD, MRCP(U.K) [address]  [place]  [place], [place] [address]  Main: [phone]  Fax: [phone]   Primary Care Physician: Paulene Floor  Primary Gastroenterologist:  Dr. Jonathon Bellows   Chief Complaint  Patient presents with  . Follow-up    Abnormal CT scan, abdominal pain    HPI: Alexa Thompson is a 58 y.o. female   Summary of history :  She was initially seen and referred on 05/05/2019.She underwent a CT scan of the abdomen for abdominal pain on 04/09/2019 . A lung rads 2 lesion was seen and in addition a 2.4 cm hypoattenuating focus seen in the head of the pancreas- further evaluation suggested. 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  She has had the CT scan for abdominal pain for over a year. Center , tender, sometimes eating makes it worse and when she coughs , localized, She takes occasional ibuprofen , describes the pain as a squeeze. No weight loss. No family history of pancreatitis or cancer. Cousin and uncle had colon cancer. No first degree relatives with colon cancer. She has been a smoker for over 30 years on and off. 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 pylori breath test: Negative 05/11/2019: EGD: Normal.  Colonoscopy: 10 mm polyp resected in the sigmoid colon and internal hemorrhoids were noted.  Biopsies from the stomach showed reactive hyperplasia.  Polyp demonstrated tubulovillous adenoma and the stalk was free of any dysplasia.  05/19/2019 she underwent EUS with Dr. Robin Searing there is no significant abnormality in the pancreatic head.  PD measured 1.5 mm.  No masses visualized.  Abnormality in the pancreatic parenchyma were noted in the neck of the pancreas pancreatic body tail and uncinate process which was described as diffuse hyperechogenic echogenicity.  Common bile duct measured 3.2 mm.  1 benign-appearing lymph node appeared in the porta hepatis region.   She says no blood in her stools or change in shape of  her stools - occasionally is small in size.   Interval history  06/17/2019-08/12/2019   06/21/2019.  CT scan of the abdomen with IV contrast: Unchanged size of the ill-defined hypodensity of the pancreatic head favoring fatty infiltration of the pancreatic head  I can see on epic that an MRI has been ordered yesterday for the pancreas.  She states that she has not yet heard from [facility] about the same.  She says she does not have much abdominal pain the IBgard seems to help her a lot.  She does complain of some change in the caliber of her stool since her colonoscopy.   Current Outpatient Medications  Medication Sig Dispense Refill  . atorvastatin (LIPITOR) 10 MG tablet TAKE 1 TABLET BY MOUTH EVERY DAY (Patient not taking: Reported on 08/12/2019) 90 tablet 0  . Tiotropium Bromide Monohydrate (SPIRIVA RESPIMAT) 2.5 MCG/ACT AERS Inhale 2 puffs into the lungs daily. (Patient not taking: Reported on 06/17/2019) 2 Inhaler 0   No current facility-administered medications for this visit.     Allergies as of 08/12/2019  . (No Known Allergies)    ROS:  General: Negative for anorexia, weight loss, fever, chills, fatigue, weakness. ENT: Negative for hoarseness, difficulty swallowing , nasal congestion. CV: Negative for chest pain, angina, palpitations, dyspnea on exertion, peripheral edema.  Respiratory: Negative for dyspnea at rest, dyspnea on exertion, cough, sputum, wheezing.  GI: See history of present illness. GU:  Negative for dysuria, hematuria, urinary incontinence, urinary frequency, nocturnal urination.  Endo: Negative for unusual weight change.    Physical Examination:   BP (!) 155/90   Pulse 77   Temp 97.8 F (36.6 C)   Ht 5\' 4"  (1.626 m)   Wt 144 lb 3.2 oz (65.4 kg)   LMP  (LMP Unknown)   BMI 24.75 kg/m   General: Well-nourished, well-developed in no acute distress.  Eyes: No icterus. Conjunctivae pink. Mouth: Oropharyngeal mucosa moist and pink , no lesions erythema or  exudate. Lungs: Clear to auscultation bilaterally. Non-labored.     Heart: Regular rate and rhythm, no murmurs rubs or gallops.  Abdomen: Bowel sounds are normal, nontender, nondistended, no hepatosplenomegaly or masses, no abdominal bruits or hernia , no rebound or guarding.   Extremities: No lower extremity edema. No clubbing or deformities. Neuro: Alert and oriented x 3.  Grossly intact. Skin: Warm and dry, no jaundice.   Psych: Alert and cooperative, normal mood and affect.   Imaging Studies: Mm Diag Breast Tomo Uni Right  Result Date: 08/02/2019 CLINICAL DATA:  58 year old patient recalled from recent 2D screening mammogram for evaluation of a possible mass in the right breast. This was seen in the MLO projection. EXAM: DIGITAL DIAGNOSTIC UNILATERAL RIGHT MAMMOGRAM WITH CAD AND TOMO COMPARISON:  July 05, 2019 and February 28, 2016 ACR Breast Density Category c: The breast tissue is heterogeneously dense, which may obscure small masses. FINDINGS: Whole breast CC and MLO views with tomography and spot compression view of the right breast with tomography show no persistent mass. Negative for architectural distortion. Findings on recent screening mammogram most consistent with overlapping fibroglandular tissue. Mammographic images were processed with CAD. IMPRESSION: No evidence of malignancy in the right breast. RECOMMENDATION: Screening mammogram in one year.(Code:SM-B-01Y) I have discussed the findings and recommendations with the patient. If applicable, a reminder letter will be sent to the patient regarding the next appointment. BI-RADS CATEGORY  1: Negative. Electronically Signed   By: Curlene Dolphin M.D.   On: 08/02/2019 15:27    Assessment and Plan:   Alexa Thompson is a 58 y.o. y/o female  here to follow-up for aabnormal CT scan of the abdomen which shows an abnormal appearing area in the head of the pancreas.  Subsequent EUS showed no abnormality.  She is being followed by Dr. Robin Searing.  I see an order placed yesterday for an MRI of the abdomen.. The abdominal pain which she has been seen for previously has responded well to IBgard.   Plan  1.  Continue IBgard 2.  Continue to follow-up with [facility]. Cephas Darby at [facility] for the pancreatic mass. 3.  Commence on fiber pills for change in caliber of the stools.  She has had a recent colonoscopy where I resected a large polyp.  Dr Jonathon Bellows  MD,MRCP [facility]) Follow up in 3 months

## 2019-09-02 DIAGNOSIS — D3502 Benign neoplasm of left adrenal gland: Secondary | ICD-10-CM | POA: Diagnosis not present

## 2019-09-02 DIAGNOSIS — K8689 Other specified diseases of pancreas: Secondary | ICD-10-CM | POA: Diagnosis not present

## 2019-10-18 ENCOUNTER — Ambulatory Visit (INDEPENDENT_AMBULATORY_CARE_PROVIDER_SITE_OTHER): Payer: BC Managed Care – PPO | Admitting: Physician Assistant

## 2019-10-18 ENCOUNTER — Encounter: Payer: Self-pay | Admitting: Physician Assistant

## 2019-10-18 ENCOUNTER — Other Ambulatory Visit: Payer: Self-pay

## 2019-10-18 VITALS — BP 138/88 | HR 74 | Temp 97.1°F | Wt 139.2 lb

## 2019-10-18 DIAGNOSIS — R21 Rash and other nonspecific skin eruption: Secondary | ICD-10-CM

## 2019-10-18 DIAGNOSIS — Z23 Encounter for immunization: Secondary | ICD-10-CM | POA: Diagnosis not present

## 2019-10-18 MED ORDER — CLOTRIMAZOLE-BETAMETHASONE 1-0.05 % EX CREA: 1.0000 "application " | TOPICAL_CREAM | Freq: Two times a day (BID) | CUTANEOUS | 1 refills | Status: DC

## 2019-10-18 NOTE — Progress Notes (Signed)
       Patient: Alexa Thompson Female    DOB: [birth year]   58 y.o.   MRN: [id number] Visit Date: 10/18/2019  Today's Provider: Trinna Post, PA-C   Chief Complaint  Patient presents with  . Rash   Subjective:     Rash This is a recurrent problem. The current episode started more than 1 month ago. The problem has been gradually worsening since onset. The affected locations include the torso. The rash is characterized by dryness, redness, itchiness and blistering. She was exposed to nothing. Past treatments include anti-itch cream.   Has tried hydrocortisone cream without relief, has tried athletes foot spray as well with no relief. Rash has been intermittent since March.   No Known Allergies   Current Outpatient Medications:  .  atorvastatin (LIPITOR) 10 MG tablet, TAKE 1 TABLET BY MOUTH EVERY DAY (Patient not taking: Reported on 08/12/2019), Disp: 90 tablet, Rfl: 0 .  Tiotropium Bromide Monohydrate (SPIRIVA RESPIMAT) 2.5 MCG/ACT AERS, Inhale 2 puffs into the lungs daily. (Patient not taking: Reported on 06/17/2019), Disp: 2 Inhaler, Rfl: 0  Review of Systems  Constitutional: Negative.   Cardiovascular: Negative.   Musculoskeletal: Negative.   Skin: Positive for rash.    Social History   Tobacco Use  . Smoking status: Current Every Day Smoker    Packs/day: 1.50    Years: 45.00    Pack years: 67.50    Types: Cigarettes  . Smokeless tobacco: Never Used  Substance Use Topics  . Alcohol use: Yes    Alcohol/week: 4.0 standard drinks    Types: 4 Cans of beer per week    Comment: previous heavy drinker October, 2016      Objective:   BP 138/88 (BP Location: Left Arm, Patient Position: Sitting, Cuff Size: Normal)   Pulse 74   Temp (!) 97.1 F (36.2 C) (Temporal)   Wt 139 lb 3.2 oz (63.1 kg)   LMP  (LMP Unknown)   BMI 23.89 kg/m  Vitals:   10/18/19 1327  BP: 138/88  Pulse: 74  Temp: (!) 97.1 F (36.2 C)  TempSrc: Temporal  Weight: 139 lb 3.2 oz (63.1 kg)   Body mass index is 23.89 kg/m.   Physical Exam Constitutional:      Appearance: Normal appearance.  Skin:    General: Skin is warm and dry.     Findings: Erythema and rash present.     Comments: Erythematous macules and some evidence of scaling as well as excoriations on bilateral axillae and to a milder extent on the abdomen.   Neurological:     Mental Status: She is alert.      No results found for any visits on 10/18/19.     Assessment & Plan    1. Rash  Eczema vs. Fungal component? Will try cream as below. Follow up if not improving.   - clotrimazole-betamethasone (LOTRISONE) cream; Apply 1 application topically 2 (two) times daily.  Dispense: 45 g; Refill: 1  2. Need for influenza vaccination  - Flu Vaccine QUAD 6+ mos PF IM (Fluarix Quad PF)  The entirety of the information documented in the History of Present Illness, Review of Systems and Physical Exam were personally obtained by me. Portions of this information were initially documented by [facility], CMA and reviewed by me for thoroughness and accuracy.          Trinna Post, PA-C  [facility]

## 2019-10-18 NOTE — Patient Instructions (Signed)
Intertrigo Intertrigo is skin irritation or inflammation (dermatitis) that occurs when folds of skin rub together. The irritation can cause a rash and make skin raw and itchy. This condition most commonly occurs in the skin folds of these areas:  Toes.  Armpits.  Groin.  Under the belly.  Under the breasts.  Buttocks. Intertrigo is not passed from person to person (is not contagious). What are the causes? This condition is caused by heat, moisture, rubbing (friction), and not enough air circulation. The condition can be made worse by:  Sweat.  Bacteria.  A fungus, such as yeast. What increases the risk? This condition is more likely to occur if you have moisture in your skin folds. You are more likely to develop this condition if you:  Have diabetes.  Are overweight.  Are not able to move around or are not active.  Live in a warm and moist climate.  Wear splints, braces, or other medical devices.  Are not able to control your bowels or bladder (have incontinence). What are the signs or symptoms? Symptoms of this condition include:  A pink or red skin rash in the skin fold or near the skin fold.  Raw or scaly skin.  Itchiness.  A burning feeling.  Bleeding.  Leaking fluid.  A bad smell. How is this diagnosed? This condition is diagnosed with a medical history and physical exam. You may also have a skin swab to test for bacteria or a fungus. How is this treated? This condition may be treated by:  Cleaning and drying your skin.  Taking an antibiotic medicine or using an antibiotic skin cream for a bacterial infection.  Using an antifungal cream on your skin or taking pills for an infection that was caused by a fungus, such as yeast.  Using a steroid ointment to relieve itchiness and irritation.  Separating the skin fold with a clean cotton cloth to absorb moisture and allow air to flow into the area. Follow these instructions at home:  Keep the  affected area clean and dry.  Do not scratch your skin.  Stay in a cool environment as much as possible. Use an air conditioner or fan, if available.  Apply over-the-counter and prescription medicines only as told by your health care provider.  If you were prescribed an antibiotic medicine, use it as told by your health care provider. Do not stop using the antibiotic even if your condition improves.  Keep all follow-up visits as told by your health care provider. This is important. How is this prevented?   Maintain a healthy weight.  Take care of your feet, especially if you have diabetes. Foot care includes: ? Wearing shoes that fit well. ? Keeping your feet dry. ? Wearing clean, breathable socks.  Protect the skin around your groin and buttocks, especially if you have incontinence. Skin protection includes: ? Following a regular cleaning routine. ? Using skin protectant creams, powders, or ointments. ? Changing protection pads frequently.  Do not wear tight clothes. Wear clothes that are loose, absorbent, and made of cotton.  Wear a bra that gives good support, if needed.  Shower and dry yourself well after activity or exercise. Use a hair dryer on a cool setting to dry between skin folds, especially after you bathe.  If you have diabetes, keep your blood sugar under control. Contact a health care provider if:  Your symptoms do not improve with treatment.  Your symptoms get worse or they spread.  You notice increased redness and  warmth.  You have a fever. Summary  Intertrigo is skin irritation or inflammation (dermatitis) that occurs when folds of skin rub together.  This condition is caused by heat, moisture, rubbing (friction), and not enough air circulation.  This condition may be treated by cleaning and drying your skin and with medicines.  Apply over-the-counter and prescription medicines only as told by your health care provider.  Keep all follow-up visits  as told by your health care provider. This is important. This information is not intended to replace advice given to you by your health care provider. Make sure you discuss any questions you have with your health care provider. Document Released: 11/04/2005 Document Revised: 04/06/2018 Document Reviewed: 04/06/2018 Elsevier Patient Education  2020 [employer].

## 2019-11-15 ENCOUNTER — Other Ambulatory Visit: Payer: Self-pay

## 2019-11-15 ENCOUNTER — Ambulatory Visit (INDEPENDENT_AMBULATORY_CARE_PROVIDER_SITE_OTHER): Payer: BC Managed Care – PPO | Admitting: Gastroenterology

## 2019-11-15 ENCOUNTER — Encounter: Payer: Self-pay | Admitting: Gastroenterology

## 2019-11-15 VITALS — BP 159/96 | HR 82 | Temp 98.4°F | Ht 64.0 in | Wt 139.0 lb

## 2019-11-15 DIAGNOSIS — R194 Change in bowel habit: Secondary | ICD-10-CM

## 2019-11-15 DIAGNOSIS — R935 Abnormal findings on diagnostic imaging of other abdominal regions, including retroperitoneum: Secondary | ICD-10-CM | POA: Diagnosis not present

## 2019-11-15 NOTE — Progress Notes (Signed)
Jonathon Bellows MD, MRCP(U.K) [address]  [place]  [place], [place] [address]  Main: [phone]  Fax: [phone]   Primary Care Physician: Trinna Post, PA-C  Primary Gastroenterologist:  Dr. Jonathon Bellows   No chief complaint on file.   HPI: Alexa Thompson is a 58 y.o. female    Summary of history :  She was initially seen and referred on 05/05/2019.She underwent a CT scan of the abdomen for abdominal pain on 04/09/2019. Alung rads 2 lesion was seen and in addition a 2.4 cm hypoattenuating focus seen in the head of the pancreas- further evaluation suggested. 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  She has had the CT scan for abdominal pain for over a year. Center , tender, sometimes eating makes it worse and when she coughs , localized, She takes occasional ibuprofen , describes the pain as a squeeze. No weight loss. No family history of pancreatitis or cancer. Cousin and uncle had colon cancer. No first degree relatives with colon cancer. She has been a smoker for over 30 years on and off. 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 pylori breath test: Negative 05/11/2019: EGD: Normal. Colonoscopy: 10 mm polyp resected in the sigmoid colon and internal hemorrhoids were noted. Biopsies from the stomach showed reactive hyperplasia. Polyp demonstrated tubulovillous adenoma and the stalk was free of any dysplasia.  05/19/2019 she underwent EUS with Dr. Robin Searing there is no significantabnormality in the pancreatic head. PD measured 1.5 mm. No masses visualized. Abnormality in the pancreatic parenchyma were noted in the neck of the pancreas pancreatic body tail and uncinate process which was described as diffuse hyperechogenic echogenicity. Common bile duct measured 3.2 mm. 1 benign-appearing lymph node appeared in the porta hepatis region.  06/21/2019.  CT scan of the abdomen with IV contrast: Unchanged size of the ill-defined hypodensity of the pancreatic  head favoring fatty infiltration of the pancreatic head    Interval history9/24/2020-11/15/2019  Abnormality of the pancreas was discussed at the multidisciplinary meeting at [facility] in October 2020.  MRI with MRCP was performed in October 2020: Benign findings noted.  No suspicious mass of the pancreas was noted.  Focal fatty infiltration of the pancreatic head noted.  Since last visit she has no GI symptoms.  Bowel movements have normalized.  No abdominal pain.  Current Outpatient Medications  Medication Sig Dispense Refill  . atorvastatin (LIPITOR) 10 MG tablet TAKE 1 TABLET BY MOUTH EVERY DAY (Patient not taking: Reported on 08/12/2019) 90 tablet 0  . clotrimazole-betamethasone (LOTRISONE) cream Apply 1 application topically 2 (two) times daily. 45 g 1  . Tiotropium Bromide Monohydrate (SPIRIVA RESPIMAT) 2.5 MCG/ACT AERS Inhale 2 puffs into the lungs daily. (Patient not taking: Reported on 06/17/2019) 2 Inhaler 0   No current facility-administered medications for this visit.    Allergies as of 11/15/2019  . (No Known Allergies)    ROS:  General: Negative for anorexia, weight loss, fever, chills, fatigue, weakness. ENT: Negative for hoarseness, difficulty swallowing , nasal congestion. CV: Negative for chest pain, angina, palpitations, dyspnea on exertion, peripheral edema.  Respiratory: Negative for dyspnea at rest, dyspnea on exertion, cough, sputum, wheezing.  GI: See history of present illness. GU:  Negative for dysuria, hematuria, urinary incontinence, urinary frequency, nocturnal urination.  Endo: Negative for unusual weight change.    Physical Examination:   LMP  (LMP Unknown)   General: Well-nourished, well-developed in no acute distress.  Eyes: No icterus. Conjunctivae pink.  Neuro: Alert and oriented x 3.  Grossly intact. Psych: Alert and cooperative, normal mood and affect.   Imaging Studies: No results found.  Assessment and Plan:   Alexa Thompson is a 58  y.o. y/o female here to follow-upfor aabnormal CT scan of the abdomen which shows an abnormal appearing area in the head of the pancreas. Subsequent EUS showed no abnormality.  Subsequent discussion at multidisciplinary meeting at [facility] documented benign lesion.  No acute issues at this time.  Reassured her that the findings on the MRI regarding the pancreas were benign.  Bowel movements have normalized.  Suggest to increase dietary fiber.   Dr Jonathon Bellows  MD,MRCP [facility]) Follow up in as needed

## 2019-11-16 ENCOUNTER — Ambulatory Visit: Payer: BC Managed Care – PPO | Admitting: Gastroenterology

## 2020-04-10 NOTE — Progress Notes (Signed)
Complete physical exam   Patient: Alexa Thompson   DOB: 1961/10/02   59 y.o. Female  MRN: [id number] Visit Date: 04/11/2020  Today's healthcare provider: Trinna Post, PA-C   Chief Complaint  Patient presents with  . Annual Exam  I,Tavaras Goody M Charlesia Canaday,acting as a scribe for Trinna Post, PA-C.,have documented all relevant documentation on the behalf of Trinna Post, PA-C,as directed by  Trinna Post, PA-C while in the presence of Trinna Post, PA-C.  Subjective    Alexa Thompson is a 59 y.o. female who presents today for a complete physical exam.  She reports consuming a general diet. Home exercise routine includes stretching and walking. She generally feels well. She reports sleeping fairly well. She does not have additional problems to discuss today.  HPI   PAP in 2020 did not have enough cellularity to be run and needs to be repeated today.   Mammogram 03/2019 with questionable right breast mass but diagnostic mammogram was negative on follow up. She has no personal or family history and would like to do every other year mammogram screenings.   Colonoscopy 04/2019 with polyps, needs to be repeated in 3 years.   Lipid/Cholesterol, Follow-up  Last lipid panel Other pertinent labs  Lab Results  Component Value Date   CHOL 177 04/09/2019   HDL 60 04/09/2019   LDLCALC 75 04/09/2019   TRIG 210 (H) 04/09/2019   CHOLHDL 3.0 04/09/2019   Lab Results  Component Value Date   ALT 13 04/09/2019   AST 22 04/09/2019   PLT 134 (L) 04/09/2019   TSH 3.060 04/09/2019     She was last seen for this 1 years ago.  Management since that visit includes lipitor 10 mg QHS.  She reports poor compliance with treatment. She is not having side effects. Patient stopped taking the medication for unknown reasons. She did not report side effects.   Symptoms: No chest pain No chest pressure/discomfort  No dyspnea No lower extremity edema  No numbness or tingling of extremity No  orthopnea  No palpitations No paroxysmal nocturnal dyspnea  No speech difficulty No syncope   Current diet: not asked Current exercise: none  The 10-year ASCVD risk score Mikey Bussing DC Jr., et al., 2013) is: 8.3%  Thoracic Aortic Aneurysm: 4 cm on 2020 Low dose CT lung cancer screening. Due for yearly CTA/MRA.  Tobacco Abuse: Patient continues to smoke.   COPD: Spiriva was cost prohibitive for her, she hasn't had this inhaler in a long time.   Alcohol Abuse: Continues to drink "too much alcohol." She reports this helps with depression. Does not want counseling or medications at this time.  ---------------------------------------------------------------------------------------------------   BP Readings from Last 3 Encounters:  04/11/20 (!) 160/100  11/15/19 (!) 159/96  10/18/19 138/88     Past Medical History:  Diagnosis Date  . Allergy   . Heart murmur   . Substance abuse [facility])    Past Surgical History:  Procedure Laterality Date  . APPENDECTOMY  age 75ish  . CESAREAN SECTION  age 71  . COLONOSCOPY WITH PROPOFOL N/A 05/11/2019   Procedure: COLONOSCOPY WITH PROPOFOL;  Surgeon: Jonathon Bellows, MD;  [facility] ENDOSCOPY;  Service: Gastroenterology;  Laterality: N/A;  . ESOPHAGOGASTRODUODENOSCOPY (EGD) WITH PROPOFOL N/A 05/11/2019   Procedure: ESOPHAGOGASTRODUODENOSCOPY (EGD) WITH PROPOFOL;  Surgeon: Jonathon Bellows, MD;  Location: [facility] ENDOSCOPY;  Service: Gastroenterology;  Laterality: N/A;  . HERNIA REPAIR  age 20ish   Social History  Socioeconomic History  . Marital status: Single    Spouse name: Not on file  . Number of children: Not on file  . Years of education: Not on file  . Highest education level: Not on file  Occupational History  . Not on file  Tobacco Use  . Smoking status: Current Every Day Smoker    Packs/day: 1.50    Years: 45.00    Pack years: 67.50    Types: Cigarettes  . Smokeless tobacco: Never Used  Substance and Sexual Activity  . Alcohol use: Yes     Alcohol/week: 4.0 standard drinks    Types: 4 Cans of beer per week    Comment: previous heavy drinker October, 2016  . Drug use: Yes    Types: "Crack" cocaine, Marijuana, Benzodiazepines, Other-see comments    Comment: recovering. last use of Cocaine was February 01, 2016  . Sexual activity: Not Currently  Other Topics Concern  . Not on file  Social History Narrative  . Not on file   Social Determinants of Health   Financial Resource Strain:   . Difficulty of Paying Living Expenses:   Food Insecurity:   . Worried About Charity fundraiser in the Last Year:   . Arboriculturist in the Last Year:   Transportation Needs:   . Film/video editor (Medical):   Marland Kitchen Lack of Transportation (Non-Medical):   Physical Activity: Sufficiently Active  . Days of Exercise per Week: 4 days  . Minutes of Exercise per Session: 60 min  Stress: No Stress Concern Present  . Feeling of Stress : Not at all  Social Connections: Somewhat Isolated  . Frequency of Communication with Friends and Family: More than three times a week  . Frequency of Social Gatherings with Friends and Family: Three times a week  . Attends Religious Services: 1 to 4 times per year  . Active Member of Clubs or Organizations: No  . Attends Archivist Meetings: Never  . Marital Status: Divorced  Human resources officer Violence: Not At Risk  . Fear of Current or Ex-Partner: No  . Emotionally Abused: No  . Physically Abused: No  . Sexually Abused: No   Family Status  Relation Name Status  . Mother  Deceased  . Father  Alive  . Neg Hx  (Not Specified)   Family History  Problem Relation Age of Onset  . Kidney disease Mother   . COPD Mother   . Hypertension Mother   . Diabetes Father   . Heart disease Father        CHF  . Breast cancer Neg Hx    No Known Allergies  Patient Care Team: Paulene Floor as PCP - General (Physician Assistant) Vern Claude, LCSW as Social Worker    Medications: Outpatient Medications Prior to Visit  Medication Sig  . Tiotropium Bromide Monohydrate (SPIRIVA RESPIMAT) 2.5 MCG/ACT AERS Inhale 2 puffs into the lungs daily. (Patient not taking: Reported on 06/17/2019)  . [DISCONTINUED] atorvastatin (LIPITOR) 10 MG tablet TAKE 1 TABLET BY MOUTH EVERY DAY (Patient not taking: Reported on 08/12/2019)  . [DISCONTINUED] clotrimazole-betamethasone (LOTRISONE) cream Apply 1 application topically 2 (two) times daily. (Patient not taking: Reported on 11/15/2019)   No facility-administered medications prior to visit.    Review of Systems  Constitutional: Negative.   HENT: Positive for sinus pressure.   Eyes: Negative.   Respiratory: Positive for cough and wheezing.   Cardiovascular: Negative.   Gastrointestinal: Negative.   Endocrine:  Negative.   Genitourinary: Negative.   Musculoskeletal: Negative.   Skin: Positive for rash.  Allergic/Immunologic: Positive for environmental allergies.  Neurological: Negative.   Hematological: Negative.   Psychiatric/Behavioral: Negative.       Objective    BP (!) 160/100 (BP Location: Left Arm, Patient Position: Sitting, Cuff Size: Normal)   Pulse 74   Temp (!) 96.9 F (36.1 C) (Temporal)   Ht 5\' 4"  (1.626 m)   Wt 140 lb 6.4 oz (63.7 kg)   LMP  (LMP Unknown)   SpO2 97%   BMI 24.10 kg/m    Physical Exam Constitutional:      Appearance: Normal appearance.  HENT:     Right Ear: Tympanic membrane, ear canal and external ear normal.     Left Ear: Tympanic membrane, ear canal and external ear normal.  Cardiovascular:     Rate and Rhythm: Normal rate and regular rhythm.     Pulses: Normal pulses.     Heart sounds: Normal heart sounds.  Pulmonary:     Effort: Pulmonary effort is normal.     Breath sounds: Normal breath sounds.  Abdominal:     General: Abdomen is flat. Bowel sounds are normal.     Palpations: Abdomen is soft.  Genitourinary:    Cervix: Discharge present.     Comments:  Some clumpy white discharge along cervix and vaginal canal.  Skin:    General: Skin is warm and dry.  Neurological:     General: No focal deficit present.     Mental Status: She is alert and oriented to person, place, and time.  Psychiatric:        Mood and Affect: Mood normal.        Behavior: Behavior normal.       Depression Screen  PHQ 2/9 Scores 04/11/2020 05/03/2019 05/03/2019  PHQ - 2 Score 2 2 2   PHQ- 9 Score 4 3 -    No results found for any visits on 04/11/20.  Assessment & Plan    Routine Health Maintenance and Physical Exam  Exercise Activities and Dietary recommendations Goals      Patient Stated   . " I want to manage my depression and alcohol use better" (pt-stated)     Current Barriers:  Marland Kitchen Mental Health Concerns  . Substance abuse issues - alcoholism  Clinical Social Work Clinical Goal(s):  Marland Kitchen Over the next 90 days, client will work with SW to address concerns related to her depression and alcohol use  Interventions: . Patient interviewed and appropriate assessments performed . Continued to provide mental health counseling with regard to anxiety and stress related to work environment and possible COVID exposures . Allowed patient to vent her frustrations, normalized her fears and explored possible coping strategies . Continued to encourage self care practices and personal responsibilities (wearing mask, washing hands regularly, keeping safe distances from others) . Discussed plans with patient for ongoing care management follow up and provided patient with direct contact information for care management team .   Patient Self Care Activities:  . Attends all scheduled provider appointments . Performs ADL's independently . Performs IADL's independently  Please see past updates related to this goal by clicking on the "Past Updates" button in the selected goal         Immunization History  Administered Date(s) Administered  . Influenza,inj,Quad PF,6+ Mos  10/18/2019    Health Maintenance  Topic Date Due  . COVID-19 Vaccine (1) Never done  . TETANUS/TDAP  Never  done  . INFLUENZA VACCINE  06/18/2020  . MAMMOGRAM  07/04/2021  . PAP SMEAR-Modifier  04/08/2022  . COLONOSCOPY  05/10/2022  . Hepatitis C Screening  Completed  . HIV Screening  Completed    Discussed health benefits of physical activity, and encouraged her to engage in regular exercise appropriate for her age and condition.  1. Annual physical exam  - TSH - Lipid panel - Comprehensive metabolic panel - CBC with Differential/Platelet  2. Elevated blood pressure reading  Suspect HTN. Return in three months for recheck and if elevated will start medications.   3. Hyperlipidemia, unspecified hyperlipidemia type  Needs to take Lipitor. Refill sent.   4. Thoracic aortic aneurysm without rupture (HCC)  4 cm on last check, monitor yearly with CTA.   - atorvastatin (LIPITOR) 10 MG tablet; Take 1 tablet (10 mg total) by mouth daily.  Dispense: 90 tablet; Refill: 0 - CT ANGIO CHEST AORTA W/CM & OR WO/CM; Future  5. Cervical cancer screening  No cellularity last PAP, resend today.  - Cytology - PAP  6. Thoracic aorta atherosclerosis (HCC)  - atorvastatin (LIPITOR) 10 MG tablet; Take 1 tablet (10 mg total) by mouth daily.  Dispense: 90 tablet; Refill: 0  7. Tobacco abuse  Counseled on quitting, patient not ready to quit.   8. Chronic obstructive pulmonary disease, unspecified COPD type ([place])  - Fluticasone-Salmeterol (WIXELA INHUB) 250-50 MCG/DOSE AEPB; Inhale 1 puff into the lungs in the morning and at bedtime.  Dispense: 60 each; Refill: 2  9. Alcohol abuse  Continues.   10. Depression, unspecified depression type  Declines counseling or medications.    Return in about 3 months (around 07/12/2020) for htn.     ITrinna Post, PA-C, have reviewed all documentation for this visit. The documentation on 04/11/20 for the exam, diagnosis, procedures, and  orders are all accurate and complete.    Paulene Floor  [facility] [phone] (phone) [phone] (fax)  [place]

## 2020-04-11 ENCOUNTER — Other Ambulatory Visit: Payer: Self-pay

## 2020-04-11 ENCOUNTER — Encounter: Payer: Self-pay | Admitting: Physician Assistant

## 2020-04-11 ENCOUNTER — Ambulatory Visit (INDEPENDENT_AMBULATORY_CARE_PROVIDER_SITE_OTHER): Payer: BC Managed Care – PPO | Admitting: Physician Assistant

## 2020-04-11 ENCOUNTER — Telehealth: Payer: Self-pay

## 2020-04-11 ENCOUNTER — Other Ambulatory Visit (HOSPITAL_COMMUNITY)
Admission: RE | Admit: 2020-04-11 | Discharge: 2020-04-11 | Disposition: A | Discharge status: Home / Self Care | Payer: BC Managed Care – PPO | Source: Ambulatory Visit | Attending: Physician Assistant | Admitting: Physician Assistant

## 2020-04-11 VITALS — BP 160/100 | HR 74 | Temp 96.9°F | Ht 64.0 in | Wt 140.4 lb

## 2020-04-11 DIAGNOSIS — F32A Depression, unspecified: Secondary | ICD-10-CM

## 2020-04-11 DIAGNOSIS — I700 Atherosclerosis of aorta: Secondary | ICD-10-CM | POA: Diagnosis not present

## 2020-04-11 DIAGNOSIS — I10 Essential (primary) hypertension: Secondary | ICD-10-CM

## 2020-04-11 DIAGNOSIS — I712 Thoracic aortic aneurysm, without rupture, unspecified: Secondary | ICD-10-CM

## 2020-04-11 DIAGNOSIS — F1010 Alcohol abuse, uncomplicated: Secondary | ICD-10-CM

## 2020-04-11 DIAGNOSIS — Z720 Tobacco use: Secondary | ICD-10-CM | POA: Diagnosis not present

## 2020-04-11 DIAGNOSIS — Z0000 Encounter for general adult medical examination without abnormal findings: Secondary | ICD-10-CM | POA: Diagnosis not present

## 2020-04-11 DIAGNOSIS — R718 Other abnormality of red blood cells: Secondary | ICD-10-CM | POA: Diagnosis not present

## 2020-04-11 DIAGNOSIS — J449 Chronic obstructive pulmonary disease, unspecified: Secondary | ICD-10-CM

## 2020-04-11 DIAGNOSIS — Z124 Encounter for screening for malignant neoplasm of cervix: Secondary | ICD-10-CM | POA: Insufficient documentation

## 2020-04-11 DIAGNOSIS — R030 Elevated blood-pressure reading, without diagnosis of hypertension: Secondary | ICD-10-CM | POA: Diagnosis not present

## 2020-04-11 DIAGNOSIS — E785 Hyperlipidemia, unspecified: Secondary | ICD-10-CM | POA: Diagnosis not present

## 2020-04-11 DIAGNOSIS — F329 Major depressive disorder, single episode, unspecified: Secondary | ICD-10-CM

## 2020-04-11 MED ORDER — FLUTICASONE-SALMETEROL 250-50 MCG/DOSE IN AEPB: 1.0000 | INHALATION_SPRAY | Freq: Two times a day (BID) | RESPIRATORY_TRACT | 2 refills | Status: DC

## 2020-04-11 MED ORDER — ATORVASTATIN CALCIUM 10 MG PO TABS: 10.0000 mg | ORAL_TABLET | Freq: Every day | ORAL | 0 refills | Status: DC

## 2020-04-11 NOTE — Telephone Encounter (Signed)
Copied from [place] [phone]. Topic: General - Inquiry >> Apr 11, 2020  1:15 PM Mathis Bud wrote: Reason for CRM: Patient states she saw PCP this morning. Patient and PCP dicussed putting patient on blood pressure medication.  Patient refused at appt but now would like PCP to call in medication.  Patient would like to pick up medication today if possible.  Pharmacy [place] [phone] [address], [place] AT [facility] OF [address]  Phone:  [phone] Fax:  [phone]  Call back [phone]

## 2020-04-12 ENCOUNTER — Telehealth: Payer: Self-pay

## 2020-04-12 LAB — CBC WITH DIFFERENTIAL/PLATELET
Basophils Absolute: 0 10*3/uL (ref 0.0–0.2)
Basos: 0 %
EOS (ABSOLUTE): 0.1 10*3/uL (ref 0.0–0.4)
Eos: 3 %
Hematocrit: 37 % (ref 34.0–46.6)
Hemoglobin: 13.2 g/dL (ref 11.1–15.9)
Immature Grans (Abs): 0 10*3/uL (ref 0.0–0.1)
Immature Granulocytes: 0 %
Lymphocytes Absolute: 0.9 10*3/uL (ref 0.7–3.1)
Lymphs: 25 %
MCH: 35.1 pg — ABNORMAL HIGH (ref 26.6–33.0)
MCHC: 35.7 g/dL (ref 31.5–35.7)
MCV: 98 fL — ABNORMAL HIGH (ref 79–97)
Monocytes Absolute: 0.4 10*3/uL (ref 0.1–0.9)
Monocytes: 10 %
Neutrophils Absolute: 2.3 10*3/uL (ref 1.4–7.0)
Neutrophils: 62 %
Platelets: 141 10*3/uL — ABNORMAL LOW (ref 150–450)
RBC: 3.76 x10E6/uL — ABNORMAL LOW (ref 3.77–5.28)
RDW: 12.6 % (ref 11.7–15.4)
WBC: 3.7 10*3/uL (ref 3.4–10.8)

## 2020-04-12 LAB — COMPREHENSIVE METABOLIC PANEL
ALT: 21 IU/L (ref 0–32)
AST: 36 IU/L (ref 0–40)
Albumin/Globulin Ratio: 1.6 (ref 1.2–2.2)
Albumin: 4.8 g/dL (ref 3.8–4.9)
Alkaline Phosphatase: 85 IU/L (ref 48–121)
BUN/Creatinine Ratio: 14 (ref 9–23)
BUN: 12 mg/dL (ref 6–24)
Bilirubin Total: 0.5 mg/dL (ref 0.0–1.2)
CO2: 19 mmol/L — ABNORMAL LOW (ref 20–29)
Calcium: 10 mg/dL (ref 8.7–10.2)
Chloride: 103 mmol/L (ref 96–106)
Creatinine, Ser: 0.87 mg/dL (ref 0.57–1.00)
GFR calc Af Amer: 84 mL/min/{1.73_m2} (ref >59)
GFR calc non Af Amer: 73 mL/min/{1.73_m2} (ref >59)
Globulin, Total: 3 g/dL (ref 1.5–4.5)
Glucose: 110 mg/dL — ABNORMAL HIGH (ref 65–99)
Potassium: 4.1 mmol/L (ref 3.5–5.2)
Sodium: 139 mmol/L (ref 134–144)
Total Protein: 7.8 g/dL (ref 6.0–8.5)

## 2020-04-12 LAB — LIPID PANEL
Chol/HDL Ratio: 2.6 ratio (ref 0.0–4.4)
Cholesterol, Total: 240 mg/dL — ABNORMAL HIGH (ref 100–199)
HDL: 91 mg/dL (ref >39)
LDL Chol Calc (NIH): 136 mg/dL — ABNORMAL HIGH (ref 0–99)
Triglycerides: 79 mg/dL (ref 0–149)
VLDL Cholesterol Cal: 13 mg/dL (ref 5–40)

## 2020-04-12 LAB — TSH: TSH: 0.876 u[IU]/mL (ref 0.450–4.500)

## 2020-04-12 MED ORDER — AMLODIPINE BESYLATE 5 MG PO TABS: 5.0000 mg | ORAL_TABLET | Freq: Every day | ORAL | 0 refills | Status: DC

## 2020-04-12 NOTE — Telephone Encounter (Signed)
Patient advised.

## 2020-04-12 NOTE — Telephone Encounter (Signed)
Sent in amlodipine 5 mg daily, needs to take it every day and not stop it. Can cause ankle swelling. And yes I did send an inhaler in.

## 2020-04-12 NOTE — Telephone Encounter (Signed)
Add on lab request was faxed to [place].

## 2020-04-12 NOTE — Telephone Encounter (Signed)
Pt seen yesterday, and a starting a bp med was discussed by pcp.  Pt has changed her mind, and would like to start a bp med now, instead of waiting to her next 3 mo fup. Pt called about this yesterday, and states an inhaler was called in.   Please advise.

## 2020-04-12 NOTE — Telephone Encounter (Signed)
-----   Message from Trinna Post, [place] sent at 04/12/2020  8:39 AM EDT ----- Can we add on Fe+ TIBC + Fer under microcytosis? Thank you.

## 2020-04-13 LAB — IRON AND TIBC
Iron Saturation: 33 % (ref 15–55)
Iron: 119 ug/dL (ref 27–159)
Total Iron Binding Capacity: 364 ug/dL (ref 250–450)
UIBC: 245 ug/dL (ref 131–425)

## 2020-04-13 LAB — FERRITIN: Ferritin: 257 ng/mL — ABNORMAL HIGH (ref 15–150)

## 2020-04-13 LAB — SPECIMEN STATUS REPORT

## 2020-04-18 LAB — CYTOLOGY - PAP
Comment: NEGATIVE
Comment: NEGATIVE
Comment: NEGATIVE
Diagnosis: NEGATIVE
HPV 16: NEGATIVE
HPV 18 / 45: NEGATIVE
High risk HPV: POSITIVE — AB

## 2020-04-20 ENCOUNTER — Other Ambulatory Visit: Payer: Self-pay

## 2020-04-20 ENCOUNTER — Ambulatory Visit
Admission: RE | Admit: 2020-04-20 | Discharge: 2020-04-20 | Disposition: A | Discharge status: Home / Self Care | Payer: BC Managed Care – PPO | Source: Ambulatory Visit | Attending: Physician Assistant | Admitting: Physician Assistant

## 2020-04-20 DIAGNOSIS — I712 Thoracic aortic aneurysm, without rupture, unspecified: Secondary | ICD-10-CM

## 2020-04-20 HISTORY — DX: Essential (primary) hypertension: I10

## 2020-04-20 MED ORDER — IOHEXOL 350 MG/ML SOLN
75.0000 mL | Freq: Once | INTRAVENOUS | Status: AC | PRN
  Administered 2020-04-20: 75 mL via INTRAVENOUS

## 2020-04-21 ENCOUNTER — Telehealth: Payer: Self-pay

## 2020-04-21 DIAGNOSIS — I712 Thoracic aortic aneurysm, without rupture, unspecified: Secondary | ICD-10-CM

## 2020-04-21 NOTE — Telephone Encounter (Signed)
Copied from [place] [phone]. Topic: General - Other >> Apr 21, 2020  3:10 PM Leward Quan A wrote: Reason for CRM: Patient called to ask Carles Collet to go ahead and refer her to the specialist that was discussed in My Chart message. Can be reached at Ph# [phone]

## 2020-04-21 NOTE — Addendum Note (Signed)
Addended by: Trinna Post on: 04/21/2020 04:12 PM   Modules accepted: Orders

## 2020-04-21 NOTE — Telephone Encounter (Signed)
Referral placed.

## 2020-05-02 ENCOUNTER — Telehealth: Payer: Self-pay | Admitting: *Deleted

## 2020-05-02 NOTE — Telephone Encounter (Signed)
(  05/02/20) Left message for pt to notify them that it is time to schedule annual low dose lung cancer screening CT scan. Instructed patient to call back to verify information prior to the scan being scheduled SRW     

## 2020-05-08 ENCOUNTER — Encounter: Payer: BC Managed Care – PPO | Admitting: Cardiothoracic Surgery

## 2020-05-09 ENCOUNTER — Encounter: Payer: BC Managed Care – PPO | Admitting: Cardiothoracic Surgery

## 2020-05-10 ENCOUNTER — Encounter: Payer: BC Managed Care – PPO | Admitting: Cardiothoracic Surgery

## 2020-05-26 ENCOUNTER — Other Ambulatory Visit: Payer: Self-pay

## 2020-05-29 ENCOUNTER — Encounter: Payer: Self-pay | Admitting: Cardiothoracic Surgery

## 2020-05-29 ENCOUNTER — Other Ambulatory Visit: Payer: Self-pay

## 2020-05-29 ENCOUNTER — Institutional Professional Consult (permissible substitution) (INDEPENDENT_AMBULATORY_CARE_PROVIDER_SITE_OTHER): Payer: BC Managed Care – PPO | Admitting: Cardiothoracic Surgery

## 2020-05-29 VITALS — BP 160/96 | HR 90 | Temp 97.8°F | Resp 20 | Ht 64.0 in | Wt 140.0 lb

## 2020-05-29 DIAGNOSIS — I7121 Aneurysm of the ascending aorta, without rupture: Secondary | ICD-10-CM

## 2020-05-29 DIAGNOSIS — I712 Thoracic aortic aneurysm, without rupture: Secondary | ICD-10-CM

## 2020-06-07 NOTE — Progress Notes (Signed)
[address]       [place] [address]             [phone]     CARDIOTHORACIC SURGERY NEW PATIENT EVALUATION  Referring Provider is Trinna Post, PA-C Primary Cardiologist is No primary care provider on file. PCP is Trinna Post, PA-C  Chief Complaint  Patient presents with   Thoracic Aortic Aneurysm    Surgical eval, CTA Chest 04/21/20    HPI:  59 year old lady presents for evaluation of ascending aortic aneurysm.  The patient is a longtime smoker who underwent chest CT lung cancer screening approximately 1 year ago.  This demonstrated incidentally discovered 4.5 cm ascending aortic aneurysm.  She was recently reimaged for serial examination, and the scan continues to show an aneurysm of approximately the same size.  Over the last year she has gained a little weight but she has been feeling well otherwise. She has no family history of aneurysm or personal history of aneurysm.  She denies chest pain or back pain Past Medical History:  Diagnosis Date   Allergy    Heart murmur    Hypertension    Substance abuse [facility])     Past Surgical History:  Procedure Laterality Date   APPENDECTOMY  age 59ish   CESAREAN SECTION  age 25   COLONOSCOPY WITH PROPOFOL N/A 05/11/2019   Procedure: COLONOSCOPY WITH PROPOFOL;  Surgeon: Jonathon Bellows, MD;  Location: [facility] ENDOSCOPY;  Service: Gastroenterology;  Laterality: N/A;   ESOPHAGOGASTRODUODENOSCOPY (EGD) WITH PROPOFOL N/A 05/11/2019   Procedure: ESOPHAGOGASTRODUODENOSCOPY (EGD) WITH PROPOFOL;  Surgeon: Jonathon Bellows, MD;  Location: [facility] ENDOSCOPY;  Service: Gastroenterology;  Laterality: N/A;   HERNIA REPAIR  age 59ish    Family History  Problem Relation Age of Onset   Kidney disease Mother    COPD Mother    Hypertension Mother    Diabetes Father    Heart disease Father        CHF   Breast cancer Neg Hx     Social History   Socioeconomic History   Marital status: Single    Spouse  name: Not on file   Number of children: Not on file   Years of education: Not on file   Highest education level: Not on file  Occupational History   Not on file  Tobacco Use   Smoking status: Current Every Day Smoker    Packs/day: 1.50    Years: 45.00    Pack years: 67.50    Types: Cigarettes   Smokeless tobacco: Never Used  Substance and Sexual Activity   Alcohol use: Yes    Alcohol/week: 4.0 standard drinks    Types: 4 Cans of beer per week    Comment: previous heavy drinker October, 2016   Drug use: Yes    Types: "Crack" cocaine, Marijuana, Benzodiazepines, Other-see comments    Comment: recovering. last use of Cocaine was February 01, 2016   Sexual activity: Not Currently  Other Topics Concern   Not on file  Social History Narrative   Not on file   Social Determinants of Health   Financial Resource Strain:    Difficulty of Paying Living Expenses:   Food Insecurity:    Worried About Charity fundraiser in the Last Year:    Arboriculturist in the Last Year:   Transportation Needs:    Lack of Transportation (Medical):    Lack of Transportation (Non-Medical):   Physical Activity:  Days of Exercise per Week:    Minutes of Exercise per Session:   Stress:    Feeling of Stress :   Social Connections:    Frequency of Communication with Friends and Family:    Frequency of Social Gatherings with Friends and Family:    Attends Religious Services:    Active Member of Clubs or Organizations:    Attends Music therapist:    Marital Status:   Intimate Partner Violence:    Fear of Current or Ex-Partner:    Emotionally Abused:    Physically Abused:    Sexually Abused:     Current Outpatient Medications  Medication Sig Dispense Refill   amLODipine (NORVASC) 5 MG tablet Take 1 tablet (5 mg total) by mouth daily. 90 tablet 0   atorvastatin (LIPITOR) 10 MG tablet Take 1 tablet (10 mg total) by mouth daily. 90 tablet 0    Fluticasone-Salmeterol (WIXELA INHUB) 250-50 MCG/DOSE AEPB Inhale 1 puff into the lungs in the morning and at bedtime. 60 each 2   No current facility-administered medications for this visit.    No Known Allergies    Review of Systems:   General:  Reduced energy, 10 pound weight gain, no fever  Cardiac:  Denies chest pain with exertion, denies SOB with exertion  Respiratory:  History of bronchitis, history of tobacco abuse t  GI:   Has undergone colonoscopy within the last 5 years  GU:   Denies kidney disease  Vascular:  Negative  Neuro:   No stroke/TIA's/seizures,   Musculoskeletal: Negative  Skin:   Negative  Psych:   Negative  Eyes:   Negative  ENT:   Negative  Hematologic:  Negative  Endocrine:  No diabetes,      Physical Exam:   BP (!) 160/96    Pulse 90    Temp 97.8 F (36.6 C) (Skin)    Resp 20    Ht 5\' 4"  (1.626 m)    Wt 63.5 kg    LMP  (LMP Unknown)    SpO2 95% Comment: RA   BMI 24.03 kg/m   General:   well-appearing, no acute distress  HEENT:  Unremarkable   Neck:   no JVD, no bruits, no adenopathy   Chest:   clear to auscultation, symmetrical breath sounds, no wheezes, no rhonchi   CV:   RRR, no murmur   Abdomen:  soft, non-tender, no masses  Extremities:  warm, well-perfused, pulses intact throughout, no LE edema  Rectal/GU  Deferred  Neuro:   Grossly non-focal and symmetrical throughout  Skin:   Clean and dry, no rashes, no breakdown   Diagnostic Tests:  I have personally reviewed her available imaging studies including CT chest from 04/19/2019 and 04/20/2020 and agree with their interpretation both of which show a roughly 4.5 cm ascending aortic aneurysm.  Impression:  59 year old lady with incidentally discovered small aneurysm.  This has not grown within the 1 year of known presence.     Plan:  Suggest repeat follow-up with CT imaging in 1 year. Encouraged tobacco cessation Continue to monitor and maintain a healthy blood pressure   I spent in  excess of 30 minutes during the conduct of this office consultation and >50% of this time involved direct face-to-face encounter with the patient for counseling and/or coordination of their care.          Level 3 Office Consult = 40 minutes         Level 4 Office Consult =  60 minutes         Level 5 Office Consult = 80 minutes  Clarence H. Roxy Manns, MD 06/07/2020 5:09 PM

## 2020-07-08 ENCOUNTER — Other Ambulatory Visit: Payer: Self-pay | Admitting: Physician Assistant

## 2020-07-08 DIAGNOSIS — I10 Essential (primary) hypertension: Secondary | ICD-10-CM

## 2020-07-08 NOTE — Telephone Encounter (Signed)
Requested Prescriptions  Pending Prescriptions Disp Refills  . amLODipine (NORVASC) 5 MG tablet [Pharmacy Med Name: AMLODIPINE BESYLATE 5MG  TABLETS] 90 tablet 0    Sig: TAKE 1 TABLET(5 MG) BY MOUTH DAILY     Cardiovascular:  Calcium Channel Blockers Failed - 07/08/2020  3:15 AM      Failed - Last BP in normal range    BP Readings from Last 1 Encounters:  05/29/20 (!) 160/96         Passed - Valid encounter within last 6 months    Recent Outpatient Visits          2 months ago Annual physical exam   [place], [place], [place]   8 months ago [place], [name], [place]   1 year ago Annual physical exam   [facility] Trinna Post, [place]   1 year ago Tobacco abuse   [place], [name], PA-C      Future Appointments            In 4 days Trinna Post, [place], [place]   In 48 months Trinna Post, [place], [place]

## 2020-07-10 ENCOUNTER — Other Ambulatory Visit: Payer: Self-pay | Admitting: Physician Assistant

## 2020-07-10 DIAGNOSIS — I712 Thoracic aortic aneurysm, without rupture, unspecified: Secondary | ICD-10-CM

## 2020-07-10 DIAGNOSIS — I700 Atherosclerosis of aorta: Secondary | ICD-10-CM

## 2020-07-10 MED ORDER — ATORVASTATIN CALCIUM 10 MG PO TABS: 10.0000 mg | ORAL_TABLET | Freq: Every day | ORAL | 1 refills | Status: DC

## 2020-07-10 NOTE — Telephone Encounter (Signed)
Medication Refill - Medication: atorvastatin (LIPITOR) 10 MG tablet (Patient is completely out of medication)   Has the patient contacted their pharmacy? yes (Agent: If no, request that the patient contact the pharmacy for the refill.) (Agent: If yes, when and what did the pharmacy advise?Contact PCP)  Preferred Pharmacy (with phone number or street name):  [id number] [address], [place] Phone:  [phone]  Fax:  [phone]       Agent: Please be advised that RX refills may take up to 3 business days. We ask that you follow-up with your pharmacy.

## 2020-07-12 ENCOUNTER — Ambulatory Visit: Payer: Self-pay | Admitting: Physician Assistant

## 2020-07-14 NOTE — Progress Notes (Signed)
Established patient visit   Patient: Alexa Thompson   DOB: Oct 21, 1961   59 y.o. Female  MRN: [id number] Visit Date: 07/17/2020  Today's healthcare provider: Trinna Post, PA-C   Chief Complaint  Patient presents with  . Hypertension   Subjective    HPI  Hypertension, follow-up  BP Readings from Last 3 Encounters:  07/17/20 (!) 154/90  05/29/20 (!) 160/96  04/11/20 (!) 160/100   Wt Readings from Last 3 Encounters:  07/17/20 145 lb 6.4 oz (66 kg)  05/29/20 140 lb (63.5 kg)  04/11/20 140 lb 6.4 oz (63.7 kg)     She was last seen for hypertension 3 months ago.  BP at that visit was 160/110. Management since that visit includes no changes.  She reports good compliance with treatment. She is not having side effects.  She is following a Regular diet. She is not exercising. She does smoke.  Use of agents associated with hypertension: none.   Outside blood pressures are checked daily. She reports that it averages in the 140s/70s. Symptoms: No chest pain No chest pressure  No palpitations No syncope  No dyspnea No orthopnea  No paroxysmal nocturnal dyspnea No lower extremity edema   Pertinent labs: Lab Results  Component Value Date   CHOL 240 (H) 04/11/2020   HDL 91 04/11/2020   LDLCALC 136 (H) 04/11/2020   TRIG 79 04/11/2020   CHOLHDL 2.6 04/11/2020   Lab Results  Component Value Date   NA 139 04/11/2020   K 4.1 04/11/2020   CREATININE 0.87 04/11/2020   GFRNONAA 73 04/11/2020   GFRAA 84 04/11/2020   GLUCOSE 110 (H) 04/11/2020     The 10-year ASCVD risk score Mikey Bussing DC Jr., et al., 2013) is: 9.9%   Tobacco Abuse  Still smoking 1/2 pack per day.       Medications: Outpatient Medications Prior to Visit  Medication Sig  . atorvastatin (LIPITOR) 10 MG tablet Take 1 tablet (10 mg total) by mouth daily.  . Fluticasone-Salmeterol (WIXELA INHUB) 250-50 MCG/DOSE AEPB Inhale 1 puff into the lungs in the morning and at bedtime.  . [DISCONTINUED]  amLODipine (NORVASC) 5 MG tablet TAKE 1 TABLET(5 MG) BY MOUTH DAILY   No facility-administered medications prior to visit.    Review of Systems  Constitutional: Negative.   Respiratory: Negative.   Cardiovascular: Negative.   Musculoskeletal: Negative.   Skin: Negative.   Neurological: Negative.       Objective    BP (!) 154/90   Pulse 81   Temp 98.9 F (37.2 C)   Ht 5\' 4"  (1.626 m)   Wt 145 lb 6.4 oz (66 kg)   LMP  (LMP Unknown)   BMI 24.96 kg/m    Physical Exam Constitutional:      Appearance: Normal appearance.  Cardiovascular:     Rate and Rhythm: Normal rate and regular rhythm.     Pulses: Normal pulses.     Heart sounds: Normal heart sounds.  Pulmonary:     Effort: Pulmonary effort is normal.     Breath sounds: Normal breath sounds.  Skin:    General: Skin is warm and dry.  Neurological:     Mental Status: She is alert. Mental status is at baseline.  Psychiatric:        Mood and Affect: Mood normal.        Behavior: Behavior normal.       No results found for any visits on 07/17/20.  Assessment &  Plan    1. Essential hypertension  Increase to 10 mg daily. Follow up in one month.   - amLODipine (NORVASC) 10 MG tablet; Take 1 tablet (10 mg total) by mouth daily.  Dispense: 90 tablet; Refill: 0  2. Ascending aortic aneurysm ([place])  Consulted with CT surgery who is monitoring, repeating CT chest in one year. Her CT chest for aneurysm can serve as lung cancer screening .   Follow up in 1 month for HTN.          Paulene Floor  [facility] [phone] (phone) [phone] (fax)  [place]

## 2020-07-17 ENCOUNTER — Other Ambulatory Visit: Payer: Self-pay

## 2020-07-17 ENCOUNTER — Encounter: Payer: Self-pay | Admitting: Physician Assistant

## 2020-07-17 ENCOUNTER — Ambulatory Visit (INDEPENDENT_AMBULATORY_CARE_PROVIDER_SITE_OTHER): Payer: BC Managed Care – PPO | Admitting: Physician Assistant

## 2020-07-17 VITALS — BP 154/90 | HR 81 | Temp 98.9°F | Ht 64.0 in | Wt 145.4 lb

## 2020-07-17 DIAGNOSIS — I7121 Aneurysm of the ascending aorta, without rupture: Secondary | ICD-10-CM

## 2020-07-17 DIAGNOSIS — R21 Rash and other nonspecific skin eruption: Secondary | ICD-10-CM

## 2020-07-17 DIAGNOSIS — I712 Thoracic aortic aneurysm, without rupture: Secondary | ICD-10-CM

## 2020-07-17 DIAGNOSIS — I10 Essential (primary) hypertension: Secondary | ICD-10-CM | POA: Diagnosis not present

## 2020-07-17 MED ORDER — AMLODIPINE BESYLATE 10 MG PO TABS: 10.0000 mg | ORAL_TABLET | Freq: Every day | ORAL | 0 refills | Status: DC

## 2020-07-17 MED ORDER — CLOTRIMAZOLE-BETAMETHASONE 1-0.05 % EX CREA: 1.0000 "application " | TOPICAL_CREAM | Freq: Two times a day (BID) | CUTANEOUS | 0 refills | Status: DC

## 2020-07-17 NOTE — Patient Instructions (Signed)

## 2020-07-18 ENCOUNTER — Other Ambulatory Visit: Payer: Self-pay | Admitting: Physician Assistant

## 2020-07-18 DIAGNOSIS — J449 Chronic obstructive pulmonary disease, unspecified: Secondary | ICD-10-CM

## 2020-07-18 NOTE — Telephone Encounter (Signed)
Requested Prescriptions  Pending Prescriptions Disp Refills  . WIXELA INHUB 250-50 MCG/DOSE AEPB [Pharmacy Med Name: WIXELA INHUB DISKUS 250/50MCG 60S] 60 each 2    Sig: INHALE 1 PUFF INTO THE LUNGS IN THE MORNING AND AT BEDTIME     Pulmonology:  Combination Products Passed - 07/18/2020  2:25 PM      Passed - Valid encounter within last 12 months    Recent Outpatient Visits          Yesterday Essential hypertension   [facility] [place], Fabio Bering M, [place]   3 months ago Annual physical exam   [place] [facility] [name], [place]   9 months ago [place], [name], [place]   1 year ago Annual physical exam   [facility] Trinna Post, [place]   1 year ago Tobacco abuse   [facility] [place], [name], [place]      Future Appointments            In 1 month Trinna Post, [place], [place]   In 8 months Trinna Post, [place], [place]

## 2020-08-15 ENCOUNTER — Encounter: Payer: Self-pay | Admitting: *Deleted

## 2020-08-16 NOTE — Progress Notes (Deleted)
     Established patient visit   Patient: Alexa Thompson   DOB: 1961-04-07   59 y.o. Female  MRN: [id number] Visit Date: 08/17/2020  Today's healthcare provider: Trinna Post, PA-C   No chief complaint on file.  Subjective    HPI  Hypertension, follow-up  BP Readings from Last 3 Encounters:  07/17/20 (!) 154/90  05/29/20 (!) 160/96  04/11/20 (!) 160/100   Wt Readings from Last 3 Encounters:  07/17/20 145 lb 6.4 oz (66 kg)  05/29/20 140 lb (63.5 kg)  04/11/20 140 lb 6.4 oz (63.7 kg)     She was last seen for hypertension 1 months ago.  BP at that visit was 154/90. Management since that visit includes increasing amlodipine to 10mg  daily.  She reports {excellent/good/fair/poor:19665} compliance with treatment. She {is/is not:9024} having side effects. {document side effects if present:1} She is following a {diet:21022986} diet. She {is/is not:9024} exercising. She {does/does not:200015} smoke.  Use of agents associated with hypertension: {bp agents assoc with hypertension:511::"none"}.   Outside blood pressures are {***enter patient reported home BP readings, or 'not being checked':1}. Symptoms: {Yes/No:20286} chest pain {Yes/No:20286} chest pressure  {Yes/No:20286} palpitations {Yes/No:20286} syncope  {Yes/No:20286} dyspnea {Yes/No:20286} orthopnea  {Yes/No:20286} paroxysmal nocturnal dyspnea {Yes/No:20286} lower extremity edema   Pertinent labs: Lab Results  Component Value Date   CHOL 240 (H) 04/11/2020   HDL 91 04/11/2020   LDLCALC 136 (H) 04/11/2020   TRIG 79 04/11/2020   CHOLHDL 2.6 04/11/2020   Lab Results  Component Value Date   NA 139 04/11/2020   K 4.1 04/11/2020   CREATININE 0.87 04/11/2020   GFRNONAA 73 04/11/2020   GFRAA 84 04/11/2020   GLUCOSE 110 (H) 04/11/2020     The 10-year ASCVD risk score Mikey Bussing DC Jr., et al., 2013) is: 9.9%    {Show patient history (optional):23778::" "}   Medications: Outpatient Medications Prior to Visit    Medication Sig  . amLODipine (NORVASC) 10 MG tablet Take 1 tablet (10 mg total) by mouth daily.  [place] Kitchen atorvastatin (LIPITOR) 10 MG tablet Take 1 tablet (10 mg total) by mouth daily.  . clotrimazole-betamethasone (LOTRISONE) cream Apply 1 application topically 2 (two) times daily.  [place] Ruts INHUB 250-50 MCG/DOSE AEPB INHALE 1 PUFF INTO THE LUNGS IN THE MORNING AND AT BEDTIME   No facility-administered medications prior to visit.    Review of Systems  Constitutional: Negative.   Respiratory: Negative.   Cardiovascular: Negative.   Gastrointestinal: Negative.   Musculoskeletal: Negative.   Neurological: Negative.     {Heme  Chem  Endocrine  Serology  Results Review (optional):23779::" "}  Objective    LMP  (LMP Unknown)  {Show previous vital signs (optional):23777::" "}  Physical Exam  ***  No results found for any visits on 08/17/20.  Assessment & Plan     ***  No follow-ups on file.      {provider attestation***:1}   Paulene Floor  [facility] [phone] (phone) [phone] (fax)  [place]

## 2020-08-17 ENCOUNTER — Ambulatory Visit: Payer: BC Managed Care – PPO | Admitting: Physician Assistant

## 2020-09-26 ENCOUNTER — Other Ambulatory Visit: Payer: Self-pay | Admitting: Physician Assistant

## 2020-09-26 DIAGNOSIS — J449 Chronic obstructive pulmonary disease, unspecified: Secondary | ICD-10-CM

## 2020-10-12 ENCOUNTER — Other Ambulatory Visit: Payer: Self-pay | Admitting: Physician Assistant

## 2020-10-12 DIAGNOSIS — I10 Essential (primary) hypertension: Secondary | ICD-10-CM

## 2020-10-12 NOTE — Telephone Encounter (Signed)
Requested medications are due for refill today yes  Requested medications are on the active medication list yes  Last refill 8/30  Last visit 06/2020  Future visit scheduled 03/2021  Notes to clinic Visit note states to return in 1 month, no upcoming visit scheduled until 03/2021.

## 2020-10-16 NOTE — Telephone Encounter (Signed)
L.O.V. was on 07/17/2020 and upcoming appointment on 04/12/2021.

## 2020-11-30 ENCOUNTER — Other Ambulatory Visit: Payer: Self-pay | Admitting: Physician Assistant

## 2020-11-30 DIAGNOSIS — I10 Essential (primary) hypertension: Secondary | ICD-10-CM

## 2021-01-12 ENCOUNTER — Other Ambulatory Visit: Payer: Self-pay | Admitting: Physician Assistant

## 2021-01-12 DIAGNOSIS — I700 Atherosclerosis of aorta: Secondary | ICD-10-CM

## 2021-01-12 DIAGNOSIS — I712 Thoracic aortic aneurysm, without rupture, unspecified: Secondary | ICD-10-CM

## 2021-04-04 IMAGING — MG DIGITAL SCREENING BILATERAL MAMMOGRAM WITH CAD
4 series · 4 of 4 positions shown · non-contrast
Comparison: Previous exam(s).

CLINICAL DATA: Screening.

EXAM:
DIGITAL SCREENING BILATERAL MAMMOGRAM WITH CAD

[L CC]
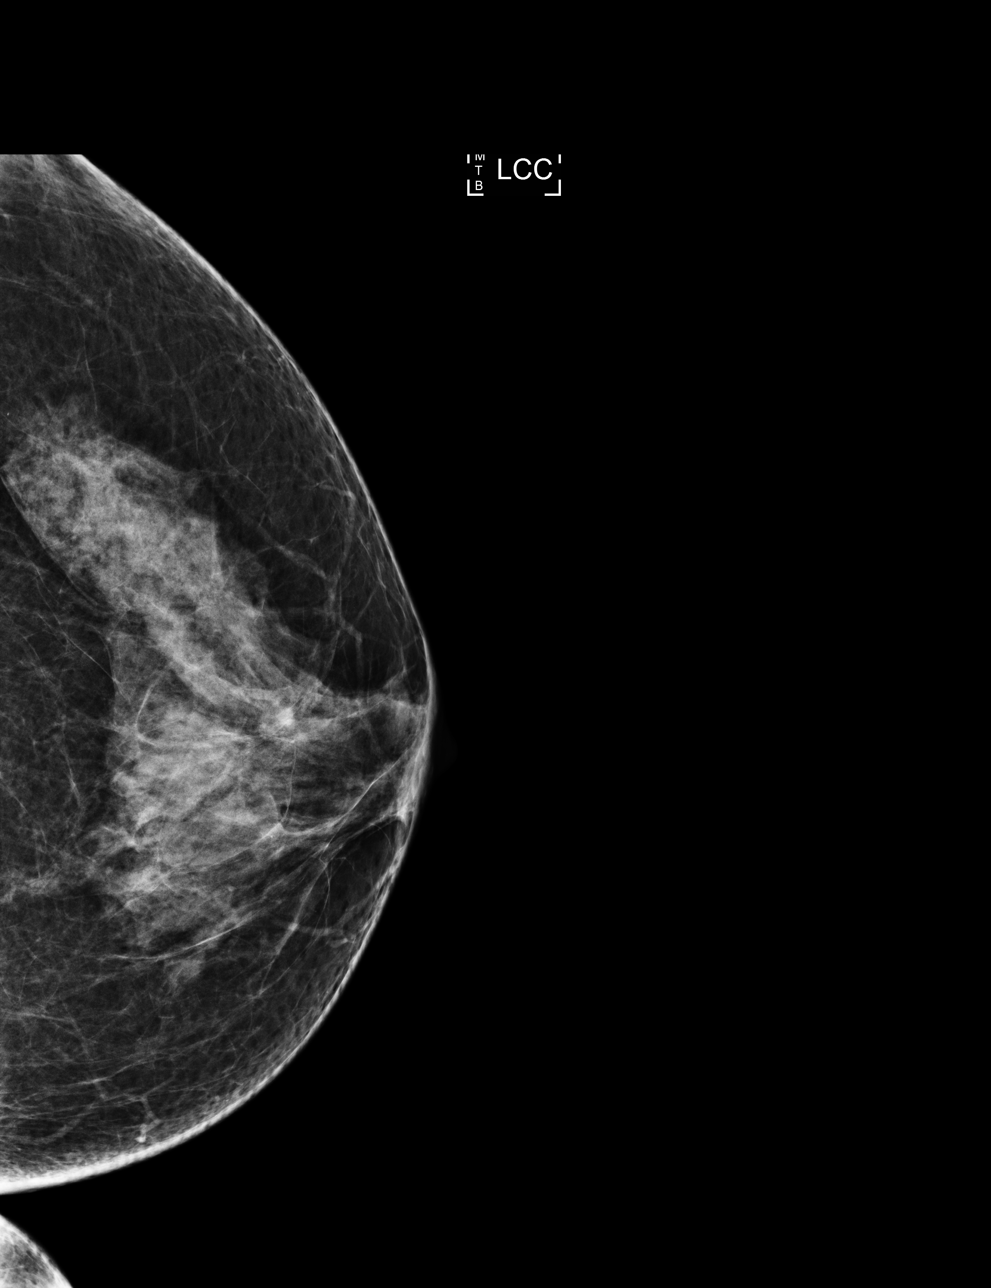

[L MLO]
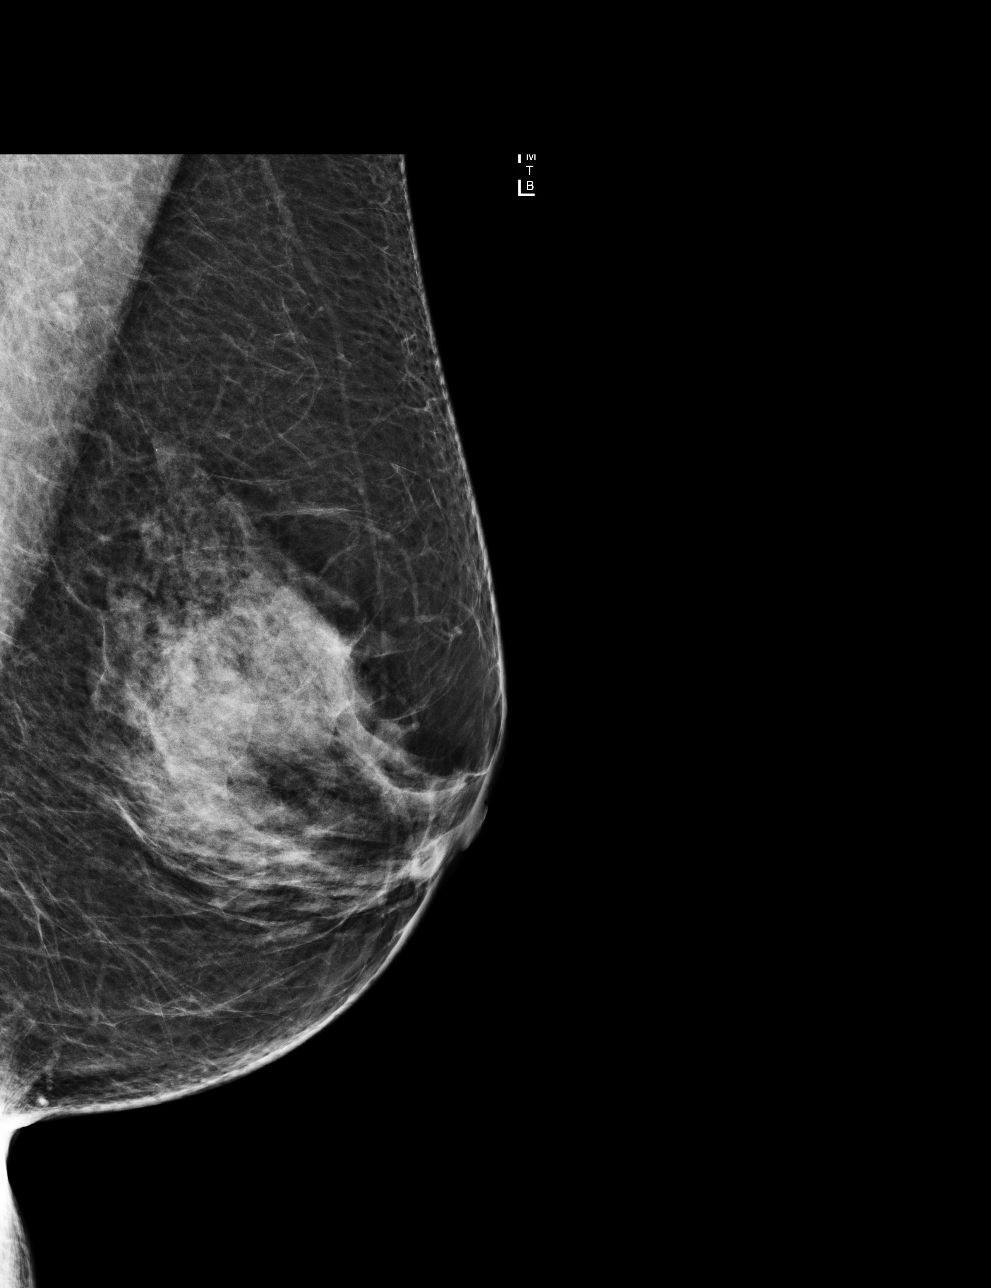

[R MLO]
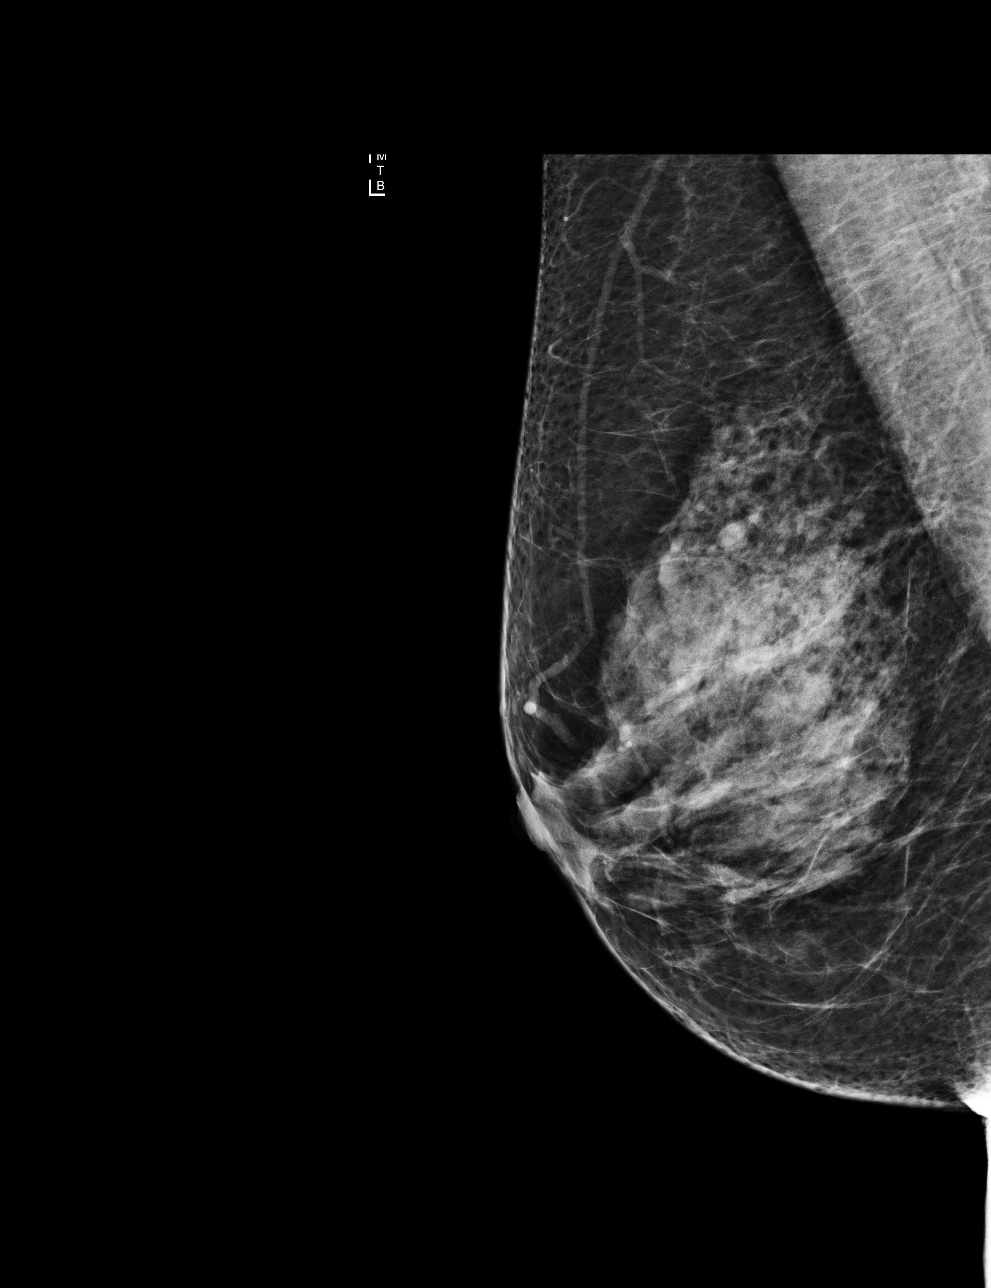

[R CC]
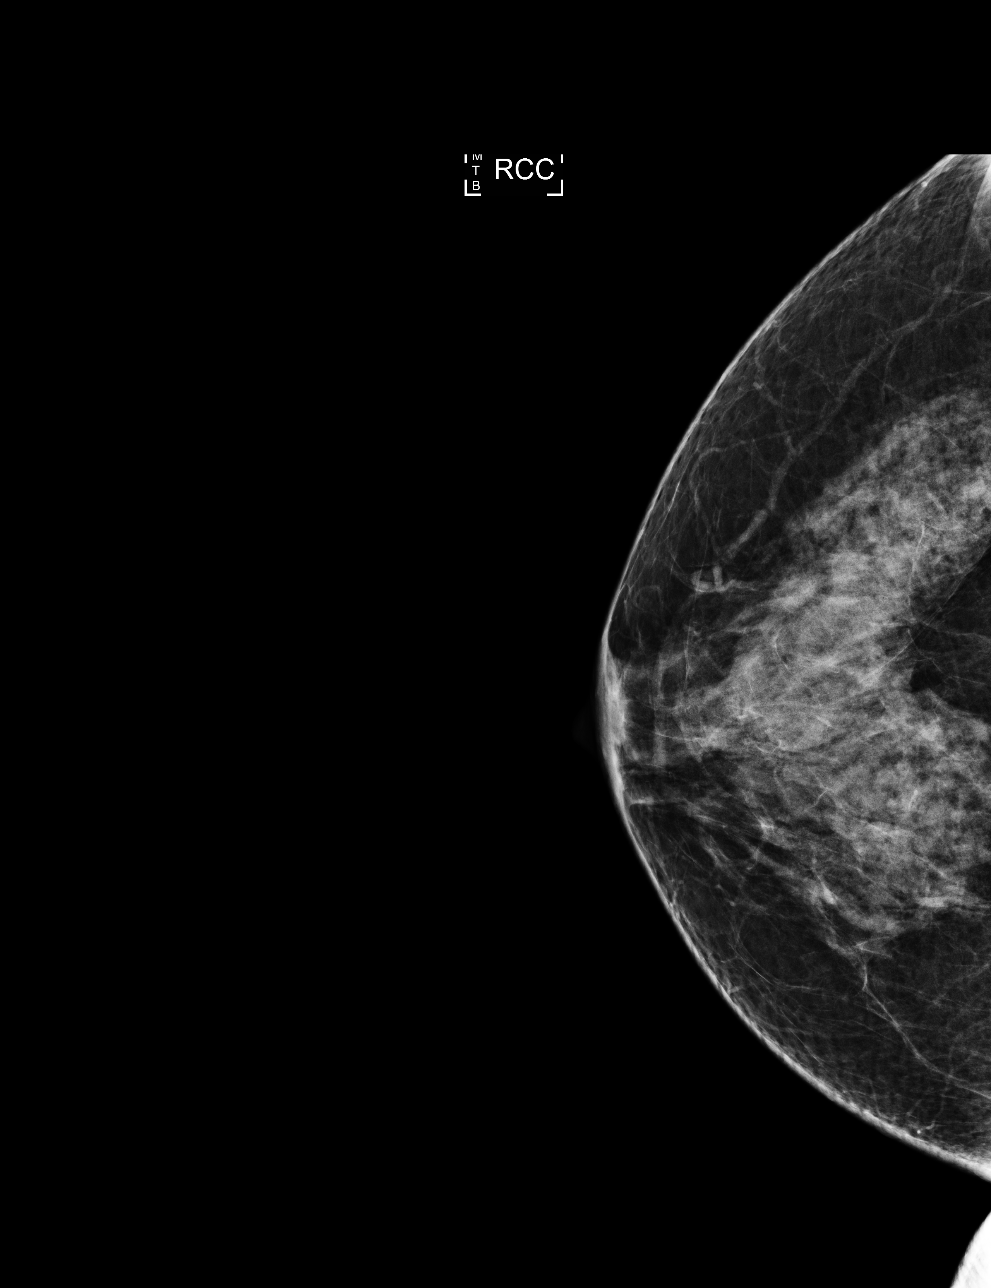

[4 of 4 positions shown; findings below may reference images not displayed]

ACR Breast Density Category c: The breast tissue is heterogeneously
dense, which may obscure small masses.
FINDINGS: In the right breast, a possible mass warrants further evaluation. In
the left breast, no findings suspicious for malignancy. Images were
processed with CAD.
IMPRESSION: Further evaluation is suggested for possible mass in the right
breast.

RECOMMENDATION:
Diagnostic mammogram and possibly ultrasound of the right breast.
(Code:[id number])

The patient will be contacted regarding the findings, and additional
imaging will be scheduled.

BI-RADS CATEGORY  0: Incomplete. Need additional imaging evaluation
and/or prior mammograms for comparison.

## 2021-04-10 ENCOUNTER — Other Ambulatory Visit: Payer: Self-pay | Admitting: Physician Assistant

## 2021-04-10 DIAGNOSIS — I712 Thoracic aortic aneurysm, without rupture, unspecified: Secondary | ICD-10-CM

## 2021-04-10 DIAGNOSIS — I700 Atherosclerosis of aorta: Secondary | ICD-10-CM

## 2021-04-10 MED ORDER — ATORVASTATIN CALCIUM 10 MG PO TABS: ORAL_TABLET | ORAL | 0 refills | Status: DC

## 2021-04-10 NOTE — Telephone Encounter (Signed)
Copied from [place] [id number]. Topic: Quick Communication - Rx Refill/Question >> Apr 10, 2021  3:06 PM Mcneil, Ja-Kwan wrote: Medication: atorvastatin (LIPITOR) 10 MG tablet  Has the patient contacted their pharmacy? yes - Pt told to call office due to no response to refill request  Preferred Pharmacy (with phone number or street name): [facility] [place], [place] AT [place] Phone: [phone]   Fax: [phone]  Agent: Please be advised that RX refills may take up to 3 business days. We ask that you follow-up with your pharmacy.

## 2021-04-10 NOTE — Telephone Encounter (Signed)
Requested Prescriptions  Pending Prescriptions Disp Refills  . atorvastatin (LIPITOR) 10 MG tablet 90 tablet 0    Sig: TAKE 1 TABLET(10 MG) BY MOUTH DAILY     Cardiovascular:  Antilipid - Statins Failed - 04/10/2021  3:15 PM      Failed - Total Cholesterol in normal range and within 360 days    Cholesterol, Total  Date Value Ref Range Status  04/11/2020 240 (H) 100 - 199 mg/dL Final         Failed - LDL in normal range and within 360 days    LDL Chol Calc (NIH)  Date Value Ref Range Status  04/11/2020 136 (H) 0 - 99 mg/dL Final         Failed - HDL in normal range and within 360 days    HDL  Date Value Ref Range Status  04/11/2020 91 >39 mg/dL Final         Failed - Triglycerides in normal range and within 360 days    Triglycerides  Date Value Ref Range Status  04/11/2020 79 0 - 149 mg/dL Final         Passed - Patient is not pregnant      Passed - Valid encounter within last 12 months    Recent Outpatient Visits          8 months ago Essential hypertension   [place], [place], PA-C   12 months ago Annual physical exam   [facility] [place], [name], [place]   1 year ago [place], [name], [place]   2 years ago Annual physical exam   [facility] [name], [place]   2 years ago Tobacco abuse   [place], [place], [place]

## 2021-04-12 ENCOUNTER — Encounter: Payer: Self-pay | Admitting: Physician Assistant

## 2021-04-30 ENCOUNTER — Other Ambulatory Visit: Payer: Self-pay | Admitting: Cardiothoracic Surgery

## 2021-04-30 DIAGNOSIS — I712 Thoracic aortic aneurysm, without rupture, unspecified: Secondary | ICD-10-CM

## 2021-05-02 IMAGING — MG MM DIGITAL DIAGNOSTIC UNILAT*R* W/ TOMO W/ CAD
6 series · 6 of 18 positions shown · non-contrast
Comparison: July 05, 2019 and February 28, 2016

CLINICAL DATA: 58-year-old patient recalled from recent 2D
screening mammogram for evaluation of a possible mass in the right
breast. This was seen in the MLO projection.

EXAM:
DIGITAL DIAGNOSTIC UNILATERAL RIGHT MAMMOGRAM WITH CAD AND TOMO

[R ML synth-2D]
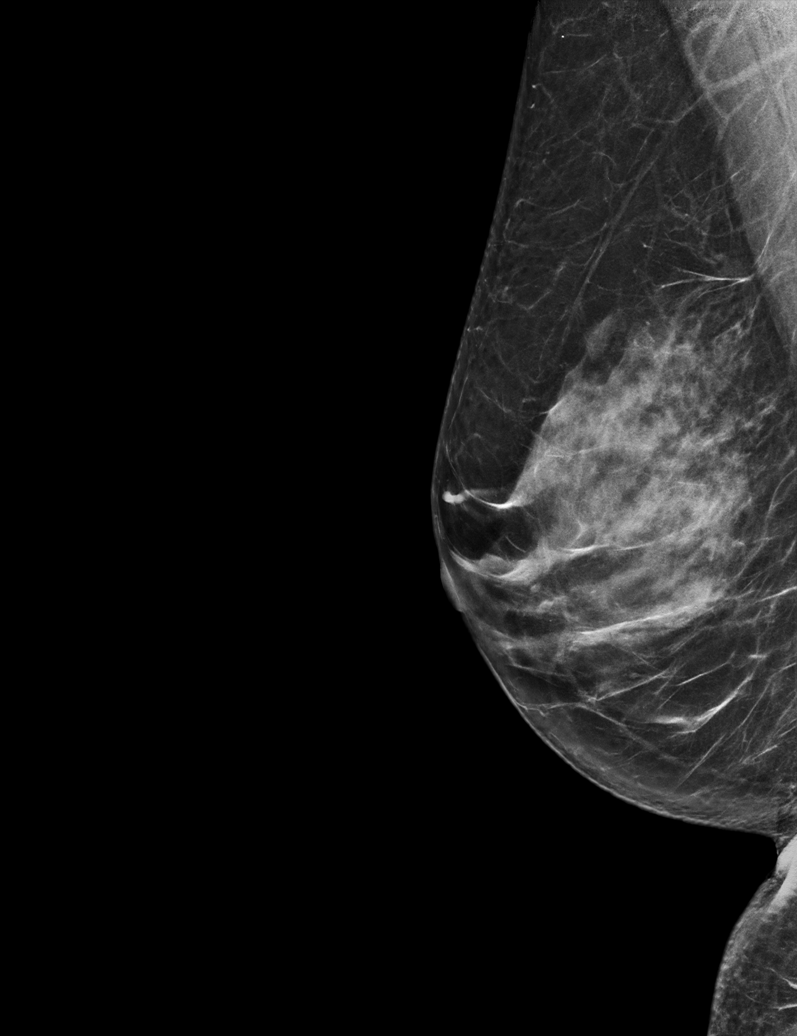

[R XCCL synth-2D]
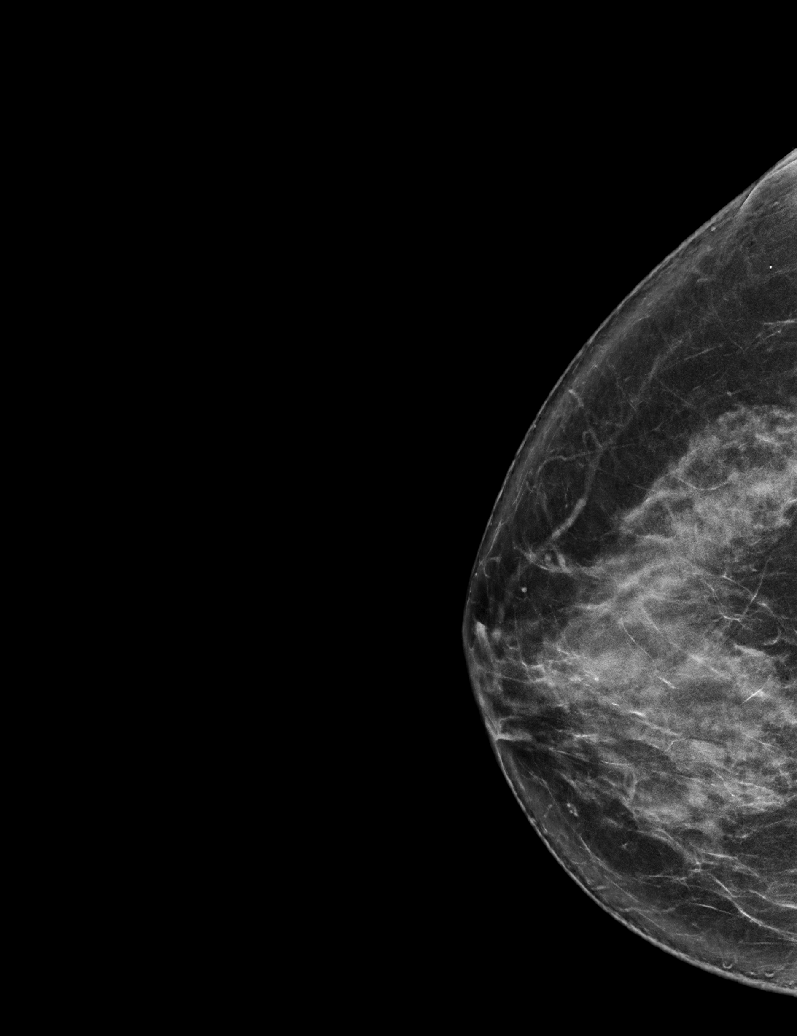

[R MLO synth-2D]
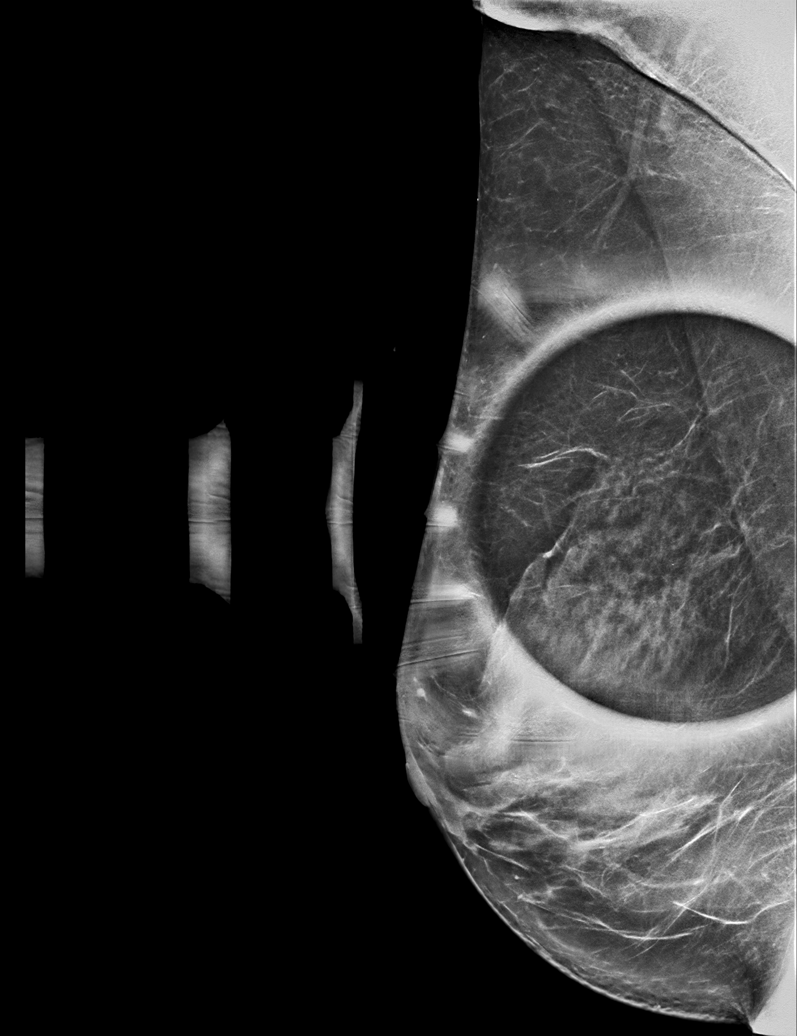

[R ML tomo · tomo slice 36/71.0]
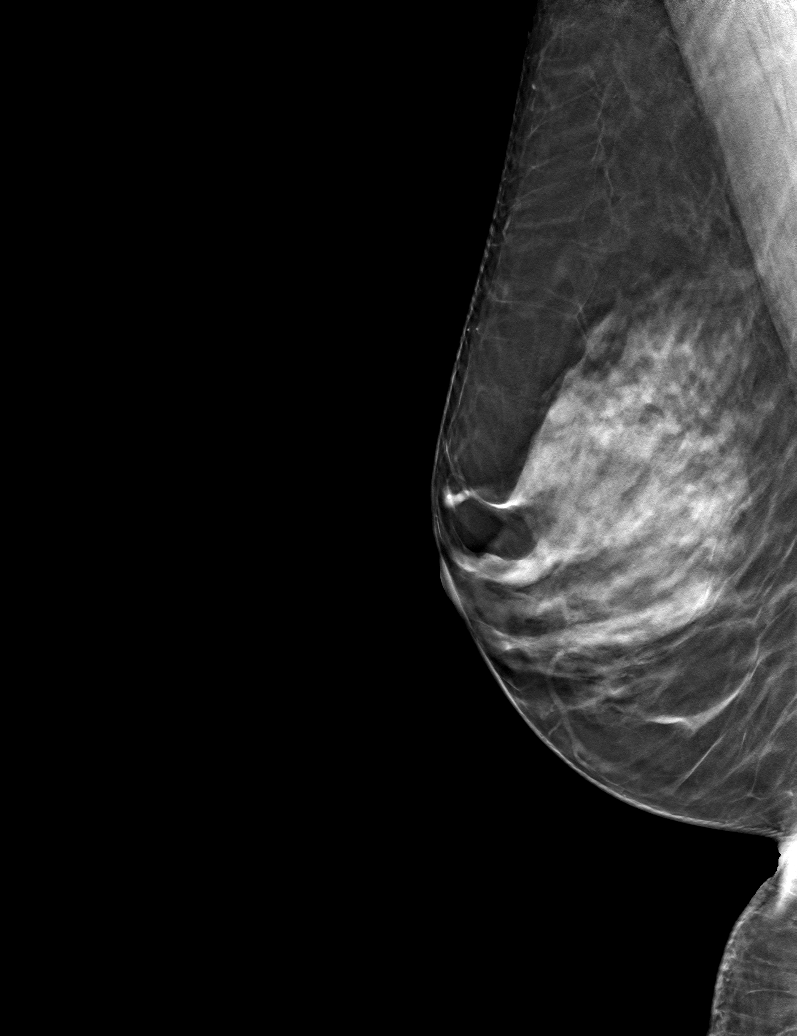

[R XCCL tomo · tomo slice 37/73.0]
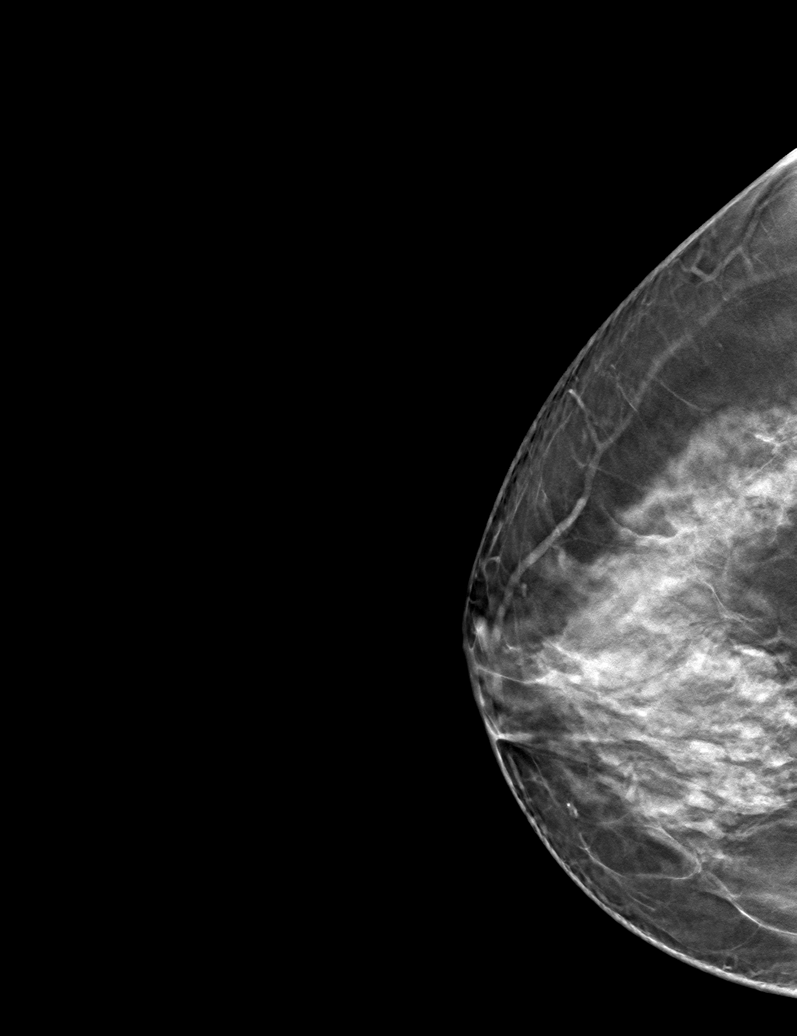

[R MLO tomo · tomo slice 33/65.0]
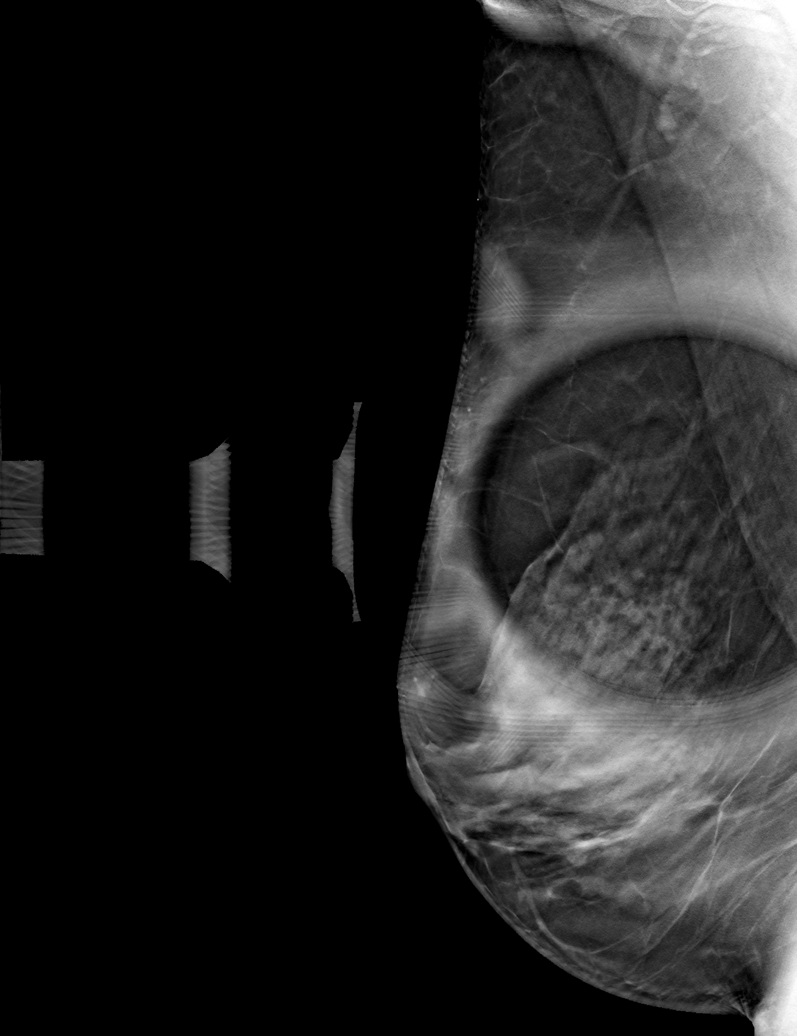

[6 of 18 positions shown; findings below may reference images not displayed]

ACR Breast Density Category c: The breast tissue is heterogeneously
dense, which may obscure small masses.
FINDINGS: Whole breast CC and MLO views with tomography and spot compression
view of the right breast with tomography show no persistent mass.
Negative for architectural distortion. Findings on recent screening
mammogram most consistent with overlapping fibroglandular tissue.

Mammographic images were processed with CAD.
IMPRESSION: No evidence of malignancy in the right breast.

RECOMMENDATION:
Screening mammogram in one year.(Code:[id number])

I have discussed the findings and recommendations with the patient.
If applicable, a reminder letter will be sent to the patient
regarding the next appointment.

BI-RADS CATEGORY  1: Negative.

## 2021-05-29 ENCOUNTER — Telehealth: Payer: Self-pay

## 2021-05-29 DIAGNOSIS — I10 Essential (primary) hypertension: Secondary | ICD-10-CM

## 2021-05-29 NOTE — Telephone Encounter (Signed)
[facility] faxed refill request for the following medications: ° °amLODipine (NORVASC) 10 MG tablet  ° ° °Please advise. °

## 2021-05-30 MED ORDER — AMLODIPINE BESYLATE 10 MG PO TABS: 10.0000 mg | ORAL_TABLET | Freq: Every day | ORAL | 0 refills | Status: DC

## 2021-06-07 ENCOUNTER — Ambulatory Visit: Payer: BC Managed Care – PPO | Admitting: Cardiothoracic Surgery

## 2021-06-07 ENCOUNTER — Other Ambulatory Visit: Payer: BC Managed Care – PPO

## 2021-06-27 ENCOUNTER — Other Ambulatory Visit: Payer: Self-pay | Admitting: Family Medicine

## 2021-06-27 DIAGNOSIS — I10 Essential (primary) hypertension: Secondary | ICD-10-CM

## 2021-06-27 NOTE — Telephone Encounter (Signed)
Requested medications are due for refill today.  yes  Requested medications are on the active medications list.  yes  Last refill. 05/30/2021  Future visit scheduled.   no  Notes to clinic.  Courtesy refill already given. Pt is more than 3 months overdue. PCP listed is Marton Redwood.

## 2021-06-28 NOTE — Telephone Encounter (Signed)
Pt called and stated that she will not have insurance until November. Pt would like to know if a 90 day supply can be called in. Pt would also like to know who she can schedule with in November.  Please advised.   Pt would like call back.

## 2021-06-29 MED ORDER — AMLODIPINE BESYLATE 10 MG PO TABS: 10.0000 mg | ORAL_TABLET | Freq: Every day | ORAL | 0 refills | Status: DC

## 2021-06-29 NOTE — Telephone Encounter (Signed)
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 Please advise. Thanks!

## 2021-06-29 NOTE — Addendum Note (Signed)
Addended by: Birdie Sons on: 06/29/2021 01:23 PM   Modules accepted: Orders

## 2021-07-12 ENCOUNTER — Other Ambulatory Visit: Payer: Self-pay | Admitting: Physician Assistant

## 2021-07-12 DIAGNOSIS — I712 Thoracic aortic aneurysm, without rupture, unspecified: Secondary | ICD-10-CM

## 2021-07-12 DIAGNOSIS — I700 Atherosclerosis of aorta: Secondary | ICD-10-CM

## 2021-07-12 NOTE — Telephone Encounter (Signed)
Requested medication (s) are due for refill today: yes  Requested medication (s) are on the active medication list:  yes  Last refill:  04/10/2021  Future visit scheduled: no  Notes to clinic:  Failed protocol:  Total Cholesterol in normal range and within 360 days   LDL in normal range and within 360 days   HDL in normal range and within 360 days   Triglycerides in normal range and within 360 days    Requested Prescriptions  Pending Prescriptions Disp Refills   atorvastatin (LIPITOR) 10 MG tablet 90 tablet 0    Sig: TAKE 1 TABLET(10 MG) BY MOUTH DAILY     Cardiovascular:  Antilipid - Statins Failed - 07/12/2021  9:22 AM      Failed - Total Cholesterol in normal range and within 360 days    Cholesterol, Total  Date Value Ref Range Status  04/11/2020 240 (H) 100 - 199 mg/dL Final          Failed - LDL in normal range and within 360 days    LDL Chol Calc (NIH)  Date Value Ref Range Status  04/11/2020 136 (H) 0 - 99 mg/dL Final          Failed - HDL in normal range and within 360 days    HDL  Date Value Ref Range Status  04/11/2020 91 >39 mg/dL Final          Failed - Triglycerides in normal range and within 360 days    Triglycerides  Date Value Ref Range Status  04/11/2020 79 0 - 149 mg/dL Final          Passed - Patient is not pregnant      Passed - Valid encounter within last 12 months    Recent Outpatient Visits           12 months ago Essential hypertension   [place], [name], PA-C   1 year ago Annual physical exam   [facility] [place], [name], [place]   1 year ago [place], [name], [place]   2 years ago Annual physical exam   [facility] [name], [place]   2 years ago Tobacco abuse   [place], [place], [place]

## 2021-07-12 NOTE — Telephone Encounter (Signed)
Medication Refill - Medication: Atorvastatin 10 mg  Has the patient contacted their pharmacy? Yes.  Pt said she called the pharmacy and they told her to call the office (Agent: If no, request that the patient contact the pharmacy for the refill.) (Agent: If yes, when and what did the pharmacy advise?)  Preferred Pharmacy (with phone number or street name): [facility] [address]  Agent: Please be advised that RX refills may take up to 3 business days. We ask that you follow-up with your pharmacy.

## 2021-07-13 MED ORDER — ATORVASTATIN CALCIUM 10 MG PO TABS: ORAL_TABLET | ORAL | 0 refills | Status: DC

## 2021-07-26 ENCOUNTER — Telehealth: Payer: Self-pay | Admitting: Family Medicine

## 2021-07-26 DIAGNOSIS — I10 Essential (primary) hypertension: Secondary | ICD-10-CM

## 2021-07-27 ENCOUNTER — Other Ambulatory Visit: Payer: Self-pay | Admitting: Family Medicine

## 2021-07-27 DIAGNOSIS — I10 Essential (primary) hypertension: Secondary | ICD-10-CM

## 2021-07-27 MED ORDER — AMLODIPINE BESYLATE 10 MG PO TABS: 10.0000 mg | ORAL_TABLET | Freq: Every day | ORAL | 0 refills | Status: DC

## 2021-07-27 NOTE — Telephone Encounter (Signed)
Requested medications are due for refill today yes  Requested medications are on the active medication list yes  Last refill 06/29/21  Last visit 06/2020  Future visit scheduled no  Notes to clinic Has already had a curtesy refill and there is no upcoming appointment scheduled.

## 2021-07-27 NOTE — Addendum Note (Signed)
Addended by: Birdie Sons on: 07/27/2021 01:35 PM   Modules accepted: Orders

## 2021-07-27 NOTE — Telephone Encounter (Signed)
Please advise   Thanks,    -Chyenne Sobczak  

## 2021-07-27 NOTE — Telephone Encounter (Signed)
Pt called stating that she is not able to come in for an apt until late November due to open enrollment and not having insurance at the moment. Pt is requesting to have advice on how to handle this. Please advise.

## 2021-08-16 ENCOUNTER — Other Ambulatory Visit: Payer: Self-pay | Admitting: Family Medicine

## 2021-08-16 DIAGNOSIS — I712 Thoracic aortic aneurysm, without rupture, unspecified: Secondary | ICD-10-CM

## 2021-08-16 DIAGNOSIS — I700 Atherosclerosis of aorta: Secondary | ICD-10-CM

## 2021-08-17 ENCOUNTER — Other Ambulatory Visit: Payer: Self-pay | Admitting: Physician Assistant

## 2021-08-17 DIAGNOSIS — I700 Atherosclerosis of aorta: Secondary | ICD-10-CM

## 2021-08-17 DIAGNOSIS — I10 Essential (primary) hypertension: Secondary | ICD-10-CM

## 2021-08-17 DIAGNOSIS — I712 Thoracic aortic aneurysm, without rupture, unspecified: Secondary | ICD-10-CM

## 2021-08-17 NOTE — Telephone Encounter (Signed)
Requested medications are due for refill today yes  Requested medications are on the active medication list yes  Last visit 07/17/20  Future visit scheduled Yes, 09/28/21  Notes to clinic refused earlier due to needs appt, has appt for 09/28/21 with Rollene Rotunda, NP, please assess.

## 2021-08-17 NOTE — Telephone Encounter (Signed)
Requested medications are due for refill today yes  Requested medications are on the active medication list yes  Last refill 07/13/21  Last visit 07/17/20  Future visit scheduled no, already given a curtesy refill  Notes to clinic Has already had a curtesy refill and there is no upcoming appointment scheduled.

## 2021-08-17 NOTE — Telephone Encounter (Signed)
Medication Refill - Medication: atorvastatin (LIPITOR) 10 MG tablet amLODipine (NORVASC) 10 MG tablet  Has the patient contacted their pharmacy? Yes.    Pt will not have insurance until 09/18/21.  Pt has made appt for 09/28/21. Can you send enough medication to get her through to her appt?  Pt is out of medication.  Preferred Pharmacy (with phone number or street name): [facility] [place], [place]  Has the patient been seen for an appointment in the last year OR does the patient have an upcoming appointment? Yes.

## 2021-08-24 ENCOUNTER — Other Ambulatory Visit: Payer: Self-pay | Admitting: Family Medicine

## 2021-08-24 ENCOUNTER — Other Ambulatory Visit: Payer: Self-pay

## 2021-08-24 DIAGNOSIS — I10 Essential (primary) hypertension: Secondary | ICD-10-CM

## 2021-08-24 MED ORDER — AMLODIPINE BESYLATE 10 MG PO TABS: 10.0000 mg | ORAL_TABLET | Freq: Every day | ORAL | 0 refills | Status: DC

## 2021-08-27 ENCOUNTER — Other Ambulatory Visit: Payer: Self-pay | Admitting: Physician Assistant

## 2021-08-27 DIAGNOSIS — I712 Thoracic aortic aneurysm, without rupture, unspecified: Secondary | ICD-10-CM

## 2021-08-27 DIAGNOSIS — I700 Atherosclerosis of aorta: Secondary | ICD-10-CM

## 2021-08-27 NOTE — Telephone Encounter (Signed)
Copied from [place] [phone]. Topic: Quick Communication - Rx Refill/Question >> Aug 27, 2021  7:36 AM Leward Quan A wrote: Medication: atorvastatin (LIPITOR) 10 MG tablet Per patient she ran out 8 days ago  Has the patient contacted their pharmacy? No. Have not contacted pharmacy but was informed to contact them first next time  (Agent: If no, request that the patient contact the pharmacy for the refill.) (Agent: If yes, when and what did the pharmacy advise?)  Preferred Pharmacy (with phone number or street name): [facility], [place] AT [place]  Phone:  [phone] Fax:  [phone]    Has the patient been seen for an appointment in the last year OR does the patient have an upcoming appointment? Yes.    Agent: Please be advised that RX refills may take up to 3 business days. We ask that you follow-up with your pharmacy.

## 2021-08-28 NOTE — Telephone Encounter (Signed)
Requested medication (s) are due for refill today: Yes  Requested medication (s) are on the active medication list: Yes  Last refill:  07/13/21  Future visit scheduled: Yes  Notes to clinic:  Protocol indicates pt. Needs lab work.    Requested Prescriptions  Pending Prescriptions Disp Refills   atorvastatin (LIPITOR) 10 MG tablet 30 tablet 0    Sig: TAKE 1 TABLET(10 MG) BY MOUTH DAILY. Please schedule office visit before any future refills.     Cardiovascular:  Antilipid - Statins Failed - 08/27/2021  4:57 PM      Failed - Total Cholesterol in normal range and within 360 days    Cholesterol, Total  Date Value Ref Range Status  04/11/2020 240 (H) 100 - 199 mg/dL Final          Failed - LDL in normal range and within 360 days    LDL Chol Calc (NIH)  Date Value Ref Range Status  04/11/2020 136 (H) 0 - 99 mg/dL Final          Failed - HDL in normal range and within 360 days    HDL  Date Value Ref Range Status  04/11/2020 91 >39 mg/dL Final          Failed - Triglycerides in normal range and within 360 days    Triglycerides  Date Value Ref Range Status  04/11/2020 79 0 - 149 mg/dL Final          Failed - Valid encounter within last 12 months    Recent Outpatient Visits           1 year ago Essential hypertension   [facility] Trinna Post, [place]   1 year ago Annual physical exam   [facility] Trinna Post, [place]   1 year ago [place], [name], [place]   2 years ago Annual physical exam   [place], [place], [place]   2 years ago Tobacco abuse   [place], [name], [place]       Future Appointments             In 1 month Gwyneth Sprout, [place], [place] - Patient is not pregnant

## 2021-08-29 ENCOUNTER — Other Ambulatory Visit: Payer: Self-pay | Admitting: Family Medicine

## 2021-08-29 DIAGNOSIS — I712 Thoracic aortic aneurysm, without rupture, unspecified: Secondary | ICD-10-CM

## 2021-08-29 DIAGNOSIS — I700 Atherosclerosis of aorta: Secondary | ICD-10-CM

## 2021-09-26 ENCOUNTER — Telehealth: Payer: Self-pay

## 2021-09-26 NOTE — Telephone Encounter (Signed)
Copied from [place] [phone]. Topic: General - Other >> Sep 26, 2021  7:12 AM Leward Quan A wrote: Reason for CRM: Patient called in to inform that her insurance have not kicked in yet and wont be available until January so she need a call back to reschedule her appointment with Ms Rollene Rotunda to sometime in January. Can be reached at Ph# [phone]

## 2021-09-26 NOTE — Telephone Encounter (Signed)
Left message to call back  

## 2021-09-28 ENCOUNTER — Ambulatory Visit: Payer: Self-pay | Admitting: Family Medicine

## 2021-11-27 ENCOUNTER — Ambulatory Visit: Payer: Self-pay | Admitting: Family Medicine

## 2021-11-27 NOTE — Progress Notes (Deleted)
°  ° ° °  Established patient visit   Patient: Alexa Thompson   DOB: December 30, 1960   61 y.o. Female  MRN: [id number] Visit Date: 11/27/2021  Today's healthcare provider: Gwyneth Sprout, FNP   No chief complaint on file.  Subjective    HPI  Hypertension, follow-up  BP Readings from Last 3 Encounters:  07/17/20 (!) 154/90  05/29/20 (!) 160/96  04/11/20 (!) 160/100   Wt Readings from Last 3 Encounters:  07/17/20 145 lb 6.4 oz (66 kg)  05/29/20 140 lb (63.5 kg)  04/11/20 140 lb 6.4 oz (63.7 kg)     She was last seen for hypertension 4 months ago.  BP at that visit was 154/90. Management since that visit includes increasing Amlodipine to 10mg .  She reports {excellent/good/fair/poor:19665} compliance with treatment. She {is/is not:9024} having side effects. {document side effects if present:1} She is following a {diet:21022986} diet. She {is/is not:9024} exercising. She {does/does not:200015} smoke.  Use of agents associated with hypertension: {bp agents assoc with hypertension:511::"none"}.   Outside blood pressures are {***enter patient reported home BP readings, or 'not being checked':1}. Symptoms: {Yes/No:20286} chest pain {Yes/No:20286} chest pressure  {Yes/No:20286} palpitations {Yes/No:20286} syncope  {Yes/No:20286} dyspnea {Yes/No:20286} orthopnea  {Yes/No:20286} paroxysmal nocturnal dyspnea {Yes/No:20286} lower extremity edema   Pertinent labs: Lab Results  Component Value Date   CHOL 240 (H) 04/11/2020   HDL 91 04/11/2020   LDLCALC 136 (H) 04/11/2020   TRIG 79 04/11/2020   CHOLHDL 2.6 04/11/2020   Lab Results  Component Value Date   NA 139 04/11/2020   K 4.1 04/11/2020   CREATININE 0.87 04/11/2020   GFRNONAA 73 04/11/2020   GLUCOSE 110 (H) 04/11/2020   TSH 0.876 04/11/2020     The ASCVD Risk score (Arnett DK, et al., 2019) failed to calculate for the following reasons:   The systolic blood pressure is missing    ---------------------------------------------------------------------------------------------------   Medications: Outpatient Medications Prior to Visit  Medication Sig   ADVAIR DISKUS 250-50 MCG/DOSE AEPB INHALE 1 PUFF INTO THE LUNGS IN THE MORNING AND AT BEDTIME   amLODipine (NORVASC) 10 MG tablet TAKE 1 TABLET(10 MG) BY MOUTH DAILY   atorvastatin (LIPITOR) 10 MG tablet TAKE 1 TABLET(10 MG) BY MOUTH DAILY   clotrimazole-betamethasone (LOTRISONE) cream Apply 1 application topically 2 (two) times daily.   No facility-administered medications prior to visit.    Review of Systems  {Labs   Heme   Chem   Endocrine   Serology   Results Review (optional):23779}   Objective    LMP  (LMP Unknown)  {Show previous vital signs (optional):23777}  Physical Exam  ***  No results found for any visits on 11/27/21.  Assessment & Plan     ***  No follow-ups on file.      {provider attestation***:1}   Gwyneth Sprout, [place] [phone] (phone) [phone] (fax)  [place]

## 2021-12-07 ENCOUNTER — Other Ambulatory Visit: Payer: Self-pay | Admitting: Family Medicine

## 2021-12-07 DIAGNOSIS — I712 Thoracic aortic aneurysm, without rupture, unspecified: Secondary | ICD-10-CM

## 2021-12-07 DIAGNOSIS — I700 Atherosclerosis of aorta: Secondary | ICD-10-CM

## 2021-12-07 MED ORDER — ATORVASTATIN CALCIUM 10 MG PO TABS: ORAL_TABLET | ORAL | 0 refills | Status: DC

## 2021-12-07 NOTE — Telephone Encounter (Signed)
Medication Refill - Medication: atorvastatin (LIPITOR) 10 MG tablet  Has the patient contacted their pharmacy? No. Pt's insurance did not go through yet, but she has rescheduled her appt to 01/01/22. Pt would like to know if she can get a 30 day supply.   Preferred Pharmacy (with phone number or street name): [facility] [place], [place]  Has the patient been seen for an appointment in the last year OR does the patient have an upcoming appointment? Yes.    Agent: Please be advised that RX refills may take up to 3 business days. We ask that you follow-up with your pharmacy.

## 2021-12-07 NOTE — Telephone Encounter (Signed)
Requested medication (s) are due for refill today: yes  Requested medication (s) are on the active medication list: yes  Last refill:  08/30/21 #90/0  Future visit scheduled: yes  Notes to clinic:  Unable to refill per protocol due to failed labs, no updated results.      Requested Prescriptions  Pending Prescriptions Disp Refills   atorvastatin (LIPITOR) 10 MG tablet 90 tablet 0    Sig: TAKE 1 TABLET(10 MG) BY MOUTH DAILY     Cardiovascular:  Antilipid - Statins Failed - 12/07/2021 12:19 PM      Failed - Total Cholesterol in normal range and within 360 days    Cholesterol, Total  Date Value Ref Range Status  04/11/2020 240 (H) 100 - 199 mg/dL Final          Failed - LDL in normal range and within 360 days    LDL Chol Calc (NIH)  Date Value Ref Range Status  04/11/2020 136 (H) 0 - 99 mg/dL Final          Failed - HDL in normal range and within 360 days    HDL  Date Value Ref Range Status  04/11/2020 91 >39 mg/dL Final          Failed - Triglycerides in normal range and within 360 days    Triglycerides  Date Value Ref Range Status  04/11/2020 79 0 - 149 mg/dL Final          Failed - Valid encounter within last 12 months    Recent Outpatient Visits           1 year ago Essential hypertension   [facility] Trinna Post, PA-C   1 year ago Annual physical exam   [facility] [name], [place]   2 years ago [place], [name], [place]   2 years ago Annual physical exam   [facility] [name], [place]   2 years ago Tobacco abuse   [place], [name], [place]       Future Appointments             In 3 weeks Gwyneth Sprout, FNP [facility], [place] - Patient is not pregnant

## 2022-01-01 ENCOUNTER — Ambulatory Visit: Payer: Self-pay | Admitting: Family Medicine

## 2022-01-01 NOTE — Progress Notes (Unsigned)
°  ° ° °  Established patient visit   Patient: Alexa Thompson   DOB: 1961-03-23   61 y.o. Female  MRN: [id number] Visit Date: 01/01/2022  Today's healthcare provider: Gwyneth Sprout, FNP   No chief complaint on file.  Subjective    HPI  Hypertension, follow-up  BP Readings from Last 3 Encounters:  07/17/20 (!) 154/90  05/29/20 (!) 160/96  04/11/20 (!) 160/100   Wt Readings from Last 3 Encounters:  07/17/20 145 lb 6.4 oz (66 kg)  05/29/20 140 lb (63.5 kg)  04/11/20 140 lb 6.4 oz (63.7 kg)     She was last seen for hypertension 18 months ago.  BP at that visit was as above. Management since that visit includes increased her Amlodipine to 10 mg daily and instructed to return in 1 month.  She reports {excellent/good/fair/poor:19665} compliance with treatment. She {is/is not:9024} having side effects. {document side effects if present:1} She is following a {diet:21022986} diet. She {is/is not:9024} exercising. She {does/does not:200015} smoke.  Use of agents associated with hypertension: {bp agents assoc with hypertension:511::"none"}.   Outside blood pressures are {***enter patient reported home BP readings, or 'not being checked':1}. Symptoms: {Yes/No:20286} chest pain {Yes/No:20286} chest pressure  {Yes/No:20286} palpitations {Yes/No:20286} syncope  {Yes/No:20286} dyspnea {Yes/No:20286} orthopnea  {Yes/No:20286} paroxysmal nocturnal dyspnea {Yes/No:20286} lower extremity edema   Pertinent labs: Lab Results  Component Value Date   CHOL 240 (H) 04/11/2020   HDL 91 04/11/2020   LDLCALC 136 (H) 04/11/2020   TRIG 79 04/11/2020   CHOLHDL 2.6 04/11/2020   Lab Results  Component Value Date   NA 139 04/11/2020   K 4.1 04/11/2020   CREATININE 0.87 04/11/2020   GFRNONAA 73 04/11/2020   GLUCOSE 110 (H) 04/11/2020   TSH 0.876 04/11/2020     The ASCVD Risk score (Arnett DK, et al., 2019) failed to calculate for the following reasons:   The systolic blood pressure is missing    ---------------------------------------------------------------------------------------------------   Medications: Outpatient Medications Prior to Visit  Medication Sig   ADVAIR DISKUS 250-50 MCG/DOSE AEPB INHALE 1 PUFF INTO THE LUNGS IN THE MORNING AND AT BEDTIME   amLODipine (NORVASC) 10 MG tablet TAKE 1 TABLET(10 MG) BY MOUTH DAILY   atorvastatin (LIPITOR) 10 MG tablet TAKE 1 TABLET(10 MG) BY MOUTH DAILY   clotrimazole-betamethasone (LOTRISONE) cream Apply 1 application topically 2 (two) times daily.   No facility-administered medications prior to visit.    Review of Systems  {Labs   Heme   Chem   Endocrine   Serology   Results Review (optional):23779}   Objective    LMP  (LMP Unknown)  {Show previous vital signs (optional):23777}  Physical Exam  ***  No results found for any visits on 01/01/22.  Assessment & Plan     ***  No follow-ups on file.      Ar[place] Ponder Miquel Stacks,acting as a scribe for Gwyneth Sprout, FNP.,have documented all relevant documentation on the behalf of Gwyneth Sprout, FNP,as directed by  Gwyneth Sprout, FNP while in the presence of Gwyneth Sprout, FNP.  {provider attestation***:1}   Gwyneth Sprout, [place] [phone] (phone) [phone] (fax)  [place]

## 2022-03-29 ENCOUNTER — Other Ambulatory Visit: Payer: Self-pay | Admitting: Physician Assistant

## 2022-03-29 DIAGNOSIS — I10 Essential (primary) hypertension: Secondary | ICD-10-CM

## 2022-03-29 DIAGNOSIS — I700 Atherosclerosis of aorta: Secondary | ICD-10-CM

## 2022-03-29 DIAGNOSIS — I712 Thoracic aortic aneurysm, without rupture, unspecified: Secondary | ICD-10-CM

## 2022-03-29 NOTE — Telephone Encounter (Signed)
Medication Refill - Medication:  ?amLODipine (NORVASC) 10 MG tablet [[id number]]  ? ?atorvastatin (LIPITOR) 10 MG tablet [[id number]]  ?Has the patient contacted their pharmacy? No. Apt Needed ? ? ?Preferred Pharmacy (with phone number or street name):  ?[facility] [place], [place] AT [facility] [address]  ?[place] [address]  ?Phone: [phone] Fax: [phone]  ?Hours: Not open 24 hours  ? ? ? ?Has the patient been seen for an appointment in the last year OR does the patient have an upcoming appointment? Yes.  04/03/22 ? ?Agent: Please be advised that RX refills may take up to 3 business days. We ask that you follow-up with your pharmacy. ? ?

## 2022-04-01 NOTE — Telephone Encounter (Signed)
last RF 08/24/21 #90 3 RF too soon ?Requested Prescriptions  ?Refused Prescriptions Disp Refills  ?? amLODipine (NORVASC) 10 MG tablet 90 tablet 3  ?  ? Cardiovascular: Calcium Channel Blockers 2 Failed - 03/29/2022 10:23 AM  ?  ?  Failed - Last BP in normal range  ?  BP Readings from Last 1 Encounters:  ?07/17/20 (!) 154/90  ?   ?  ?  Failed - Valid encounter within last 6 months  ?  Recent Outpatient Visits   ?      ? 1 year ago Essential hypertension  ? [facility] [place], [name], [place]  ? 1 year ago Annual physical exam  ? [facility] [name], [place]  ? 2 years ago Rash  ? [facility] [place], [name], [place]  ? 2 years ago Annual physical exam  ? [facility] [name], [place]  ? 3 years ago Tobacco abuse  ? [facility] [place], [place], [place]  ?  ?  ?Future Appointments   ?        ? In 2 days Alexa Thompson, [place], [facility]  ?  ? ?  ?  ?  Passed - Last Heart Rate in normal range  ?  Pulse Readings from Last 1 Encounters:  ?07/17/20 81  ?   ?  ?  ?? atorvastatin (LIPITOR) 10 MG tablet 30 tablet   ?  Sig: TAKE 1 TABLET(10 MG) BY MOUTH DAILY  ?  ? Cardiovascular:  Antilipid - Statins Failed - 03/29/2022 10:23 AM  ?  ?  Failed - Valid encounter within last 12 months  ?  Recent Outpatient Visits   ?      ? 1 year ago Essential hypertension  ? [facility] [place], [name], [place]  ? 1 year ago Annual physical exam  ? [facility] [name], [place]  ? 2 years ago Rash  ? [facility] [place], [name], [place]  ? 2 years ago Annual physical exam  ? [facility] [name], [place]  ? 3 years ago Tobacco abuse  ? [facility] [place], [place] M, [place]  ?  ?  ?Future Appointments   ?        ? In 2 days Alexa Thompson, [place], [facility]  ?  ? ?  ?  ?  Failed - Lipid Panel in normal range within the last 12 months  ?   Cholesterol, Total  ?Date Value Ref Range Status  ?04/11/2020 240 (H) 100 - 199 mg/dL Final  ? ?LDL Chol Calc (NIH)  ?Date Value Ref Range Status  ?04/11/2020 136 (H) 0 - 99 mg/dL Final  ? ?HDL  ?Date Value Ref Range Status  ?04/11/2020 91 >39 mg/dL Final  ? ?Triglycerides  ?Date Value Ref Range Status  ?04/11/2020 79 0 - 149 mg/dL Final  ? ?  ?  ?  Passed - Patient is not pregnant  ?  ?  ? ? ?

## 2022-04-01 NOTE — Telephone Encounter (Signed)
Requested Prescriptions  ?Pending Prescriptions Disp Refills  ?? amLODipine (NORVASC) 10 MG tablet 90 tablet 3  ?  ? Cardiovascular: Calcium Channel Blockers 2 Failed - 03/29/2022 10:23 AM  ?  ?  Failed - Last BP in normal range  ?  BP Readings from Last 1 Encounters:  ?07/17/20 (!) 154/90  ?   ?  ?  Failed - Valid encounter within last 6 months  ?  Recent Outpatient Visits   ?      ? 1 year ago Essential hypertension  ? [facility] [place], [name], [place]  ? 1 year ago Annual physical exam  ? [facility] [name], [place]  ? 2 years ago Rash  ? [facility] [place], [name], [place]  ? 2 years ago Annual physical exam  ? [facility] [name], [place]  ? 3 years ago Tobacco abuse  ? [facility] [place], [place] M, [place]  ?  ?  ?Future Appointments   ?        ? In 2 days Gwyneth Sprout, [place], [facility]  ?  ? ?  ?  ?  Passed - Last Heart Rate in normal range  ?  Pulse Readings from Last 1 Encounters:  ?07/17/20 81  ?   ?  ?  ?? atorvastatin (LIPITOR) 10 MG tablet 30 tablet   ?  Sig: TAKE 1 TABLET(10 MG) BY MOUTH DAILY  ?  ? Cardiovascular:  Antilipid - Statins Failed - 03/29/2022 10:23 AM  ?  ?  Failed - Valid encounter within last 12 months  ?  Recent Outpatient Visits   ?      ? 1 year ago Essential hypertension  ? [facility] [place], [name], [place]  ? 1 year ago Annual physical exam  ? [facility] [name], [place]  ? 2 years ago Rash  ? [facility] [place], [name], [place]  ? 2 years ago Annual physical exam  ? [facility] [name], [place]  ? 3 years ago Tobacco abuse  ? [facility] [place], [place] M, [place]  ?  ?  ?Future Appointments   ?        ? In 2 days Gwyneth Sprout, [place], [facility]  ?  ? ?  ?  ?  Failed - Lipid Panel in normal range within the last 12 months  ?  Cholesterol, Total  ?Date Value Ref Range  Status  ?04/11/2020 240 (H) 100 - 199 mg/dL Final  ? ?LDL Chol Calc (NIH)  ?Date Value Ref Range Status  ?04/11/2020 136 (H) 0 - 99 mg/dL Final  ? ?HDL  ?Date Value Ref Range Status  ?04/11/2020 91 >39 mg/dL Final  ? ?Triglycerides  ?Date Value Ref Range Status  ?04/11/2020 79 0 - 149 mg/dL Final  ? ?  ?  ?  Passed - Patient is not pregnant  ?  ?  ? ? ?

## 2022-04-02 NOTE — Progress Notes (Signed)
?  ? ? ?Established patient visit ? ? ?Patient: Alexa Thompson   DOB: Jan 26, 1961   61 y.o. Female  MRN: [id number] ?Visit Date: 04/03/2022 ? ?Today's healthcare provider: Gwyneth Sprout, FNP  ?Patient presents for new patient visit to establish care.  Introduced to Designer, jewellery role and practice setting.  All questions answered.  Discussed provider/patient relationship and expectations. ? ? ?[employer] as a Education administrator for Gwyneth Sprout, FNP.,have documented all relevant documentation on the behalf of Gwyneth Sprout, FNP,as directed by  Gwyneth Sprout, FNP while in the presence of Gwyneth Sprout, FNP.  ?Chief Complaint  ?Patient presents with  ? Hypertension  ? ?Subjective  ?  ?HPI  ?Hypertension, follow-up ? ?BP Readings from Last 3 Encounters:  ?04/03/22 (!) 152/81  ?07/17/20 (!) 154/90  ?05/29/20 (!) 160/96  ? Wt Readings from Last 3 Encounters:  ?04/03/22 127 lb 1.6 oz (57.7 kg)  ?07/17/20 145 lb 6.4 oz (66 kg)  ?05/29/20 140 lb (63.5 kg)  ?  ? ?She was last seen for hypertension 21 months ago.  ?BP at that visit was 154/90. Management since that visit includes increase  Amlodipine to '10mg'$  daily. ? ?She reports poor compliance with treatment. ?She is not having side effects.  ?She is following a Regular diet. ?She is exercising. ?She does smoke. ? ?Use of agents associated with hypertension: none.  ? ?Outside blood pressures are systolic 638-756 diastolic 43-329. ?Symptoms: ?No chest pain No chest pressure  ?No palpitations No syncope  ?No dyspnea No orthopnea  ?No paroxysmal nocturnal dyspnea No lower extremity edema  ? ?Pertinent labs ?Lab Results  ?Component Value Date  ? CHOL 240 (H) 04/11/2020  ? HDL 91 04/11/2020  ? LDLCALC 136 (H) 04/11/2020  ? TRIG 79 04/11/2020  ? CHOLHDL 2.6 04/11/2020  ? Lab Results  ?Component Value Date  ? NA 139 04/11/2020  ? K 4.1 04/11/2020  ? CREATININE 0.87 04/11/2020  ? GFRNONAA 73 04/11/2020  ? GLUCOSE 110 (H) 04/11/2020  ? TSH 0.876 04/11/2020  ?  ? ?The 10-year  ASCVD risk score (Arnett DK, et al., 2019) is: 10.6% ? ?---------------------------------------------------------------------------------------------------  ? ?Medications: ?Outpatient Medications Prior to Visit  ?Medication Sig  ? clotrimazole-betamethasone (LOTRISONE) cream Apply 1 application topically 2 (two) times daily. (Patient not taking: Reported on 04/03/2022)  ? [DISCONTINUED] ADVAIR DISKUS 250-50 MCG/DOSE AEPB INHALE 1 PUFF INTO THE LUNGS IN THE MORNING AND AT BEDTIME (Patient not taking: Reported on 04/03/2022)  ? [DISCONTINUED] amLODipine (NORVASC) 10 MG tablet TAKE 1 TABLET(10 MG) BY MOUTH DAILY (Patient not taking: Reported on 04/03/2022)  ? [DISCONTINUED] atorvastatin (LIPITOR) 10 MG tablet TAKE 1 TABLET(10 MG) BY MOUTH DAILY (Patient not taking: Reported on 04/03/2022)  ? ?No facility-administered medications prior to visit.  ? ? ?Review of Systems ? ? ?  Objective  ?  ?BP (!) 152/81   Pulse 70   Temp 98.5 ?F (36.9 ?C) (Oral)   Resp 16   Wt 127 lb 1.6 oz (57.7 kg)   LMP  (LMP Unknown)   BMI 21.82 kg/m?  ? ? ?Physical Exam ?Vitals and nursing note reviewed.  ?Constitutional:   ?   General: She is not in acute distress. ?   Appearance: Normal appearance. She is overweight. She is not ill-appearing, toxic-appearing or diaphoretic.  ?HENT:  ?   Head: Normocephalic and atraumatic.  ?Cardiovascular:  ?   Rate and Rhythm: Normal rate and regular rhythm.  ?   Pulses: Normal  pulses.  ?   Heart sounds: Normal heart sounds. No murmur heard. ?  No friction rub. No gallop.  ?Pulmonary:  ?   Effort: Pulmonary effort is normal. No respiratory distress.  ?   Breath sounds: Normal breath sounds. No stridor. No wheezing, rhonchi or rales.  ?Chest:  ?   Chest wall: No tenderness.  ?Abdominal:  ?   General: Bowel sounds are normal.  ?   Palpations: Abdomen is soft.  ?Musculoskeletal:     ?   General: No swelling, tenderness, deformity or signs of injury. Normal range of motion.  ?   Right lower leg: No edema.  ?    Left lower leg: No edema.  ?Skin: ?   General: Skin is warm and dry.  ?   Capillary Refill: Capillary refill takes less than 2 seconds.  ?   Coloration: Skin is not jaundiced or pale.  ?   Findings: No bruising, erythema, lesion or rash.  ?Neurological:  ?   General: No focal deficit present.  ?   Mental Status: She is alert and oriented to person, place, and time. Mental status is at baseline.  ?   Cranial Nerves: No cranial nerve deficit.  ?   Sensory: No sensory deficit.  ?   Motor: No weakness.  ?   Coordination: Coordination normal.  ?Psychiatric:     ?   Mood and Affect: Mood normal.     ?   Behavior: Behavior normal.     ?   Thought Content: Thought content normal.     ?   Judgment: Judgment normal.  ?  ? ? ?No results found for any visits on 04/03/22. ? Assessment & Plan  ?  ? ?Problem List Items Addressed This Visit   ? ?  ? Cardiovascular and Mediastinum  ? Essential hypertension, benign - Primary  ?  Chronic, unstable; has been off medication d/t cost/lack of insurance  ?Denies CP ?Denies SOB/ DOE ?Denies low blood pressure/hypotension ?Denies vision changes ?Start/change medication, Hyzaar 50-12.5 ?Denies side effects ?RTC 3 months for CPE ?Seek emergent care if you develop chest pain or chest pressure ? ?  ?  ? Relevant Medications  ? losartan-hydrochlorothiazide (HYZAAR) 50-12.5 MG tablet  ? rosuvastatin (CRESTOR) 10 MG tablet  ?  ? Respiratory  ? Chronic obstructive pulmonary disease (HCC)  ?  Chronic, stable ?Wishes for sample of daily inhaler ?14 day trial of Breztri provided  ?Encouraged to stop tobacco products  ? ?  ?  ? Relevant Medications  ? Budeson-Glycopyrrol-Formoterol (BREZTRI AEROSPHERE) 160-9-4.8 MCG/ACT AERO  ?  ? Other  ? Elevated LDL cholesterol level  ?  Chronic, unstable ?ASCVD risk elevated ?Labs > 34 years old ?Recommend increase strength of statin ?Encouraged to use GoodRx for cost savings ?RTC in 3 months for labs/CPE  ? ?  ?  ? Relevant Medications  ? rosuvastatin (CRESTOR) 10  MG tablet  ? ?Return in about 3 months (around 07/04/2022) for annual examination.  ?   ? ?I, Gwyneth Sprout, FNP, have reviewed all documentation for this visit. The documentation on 04/03/22 for the exam, diagnosis, procedures, and orders are all accurate and complete. ? ?Gwyneth Sprout, FNP  ?[place] ?[phone] (phone) ?[phone] (fax) ? ?[facility] ?

## 2022-04-03 ENCOUNTER — Ambulatory Visit (INDEPENDENT_AMBULATORY_CARE_PROVIDER_SITE_OTHER): Payer: Self-pay | Admitting: Family Medicine

## 2022-04-03 ENCOUNTER — Encounter: Payer: Self-pay | Admitting: Family Medicine

## 2022-04-03 VITALS — BP 152/81 | HR 70 | Temp 98.5°F | Resp 16 | Wt 127.1 lb

## 2022-04-03 DIAGNOSIS — E7800 Pure hypercholesterolemia, unspecified: Secondary | ICD-10-CM | POA: Insufficient documentation

## 2022-04-03 DIAGNOSIS — I10 Essential (primary) hypertension: Secondary | ICD-10-CM | POA: Insufficient documentation

## 2022-04-03 DIAGNOSIS — J449 Chronic obstructive pulmonary disease, unspecified: Secondary | ICD-10-CM

## 2022-04-03 MED ORDER — LOSARTAN POTASSIUM-HCTZ 50-12.5 MG PO TABS: 1.0000 | ORAL_TABLET | Freq: Every day | ORAL | 3 refills | Status: DC

## 2022-04-03 MED ORDER — ROSUVASTATIN CALCIUM 10 MG PO TABS: 10.0000 mg | ORAL_TABLET | Freq: Every day | ORAL | 3 refills | Status: DC

## 2022-04-03 MED ORDER — BREZTRI AEROSPHERE 160-9-4.8 MCG/ACT IN AERO: 2.0000 | INHALATION_SPRAY | Freq: Two times a day (BID) | RESPIRATORY_TRACT | 11 refills | Status: DC

## 2022-04-03 NOTE — Assessment & Plan Note (Signed)
Chronic, stable ?Wishes for sample of daily inhaler ?14 day trial of Breztri provided  ?Encouraged to stop tobacco products  ?

## 2022-04-03 NOTE — Patient Instructions (Signed)
The 10-year ASCVD risk score (Arnett DK, et al., 2019) is: 10.6% ?  Values used to calculate the score: ?    Age: 61 years ?    Sex: Female ?    Is Non-Hispanic African American: No ?    Diabetic: No ?    Tobacco smoker: Yes ?    Systolic Blood Pressure: 854 mmHg ?    Is BP treated: Yes ?    HDL Cholesterol: 91 mg/dL ?    Total Cholesterol: 240 mg/dL ? ?

## 2022-04-03 NOTE — Assessment & Plan Note (Signed)
Chronic, unstable; has been off medication d/t cost/lack of insurance  ?Denies CP ?Denies SOB/ DOE ?Denies low blood pressure/hypotension ?Denies vision changes ?Start/change medication, Hyzaar 50-12.5 ?Denies side effects ?RTC 3 months for CPE ?Seek emergent care if you develop chest pain or chest pressure ? ?

## 2022-04-03 NOTE — Assessment & Plan Note (Signed)
Chronic, unstable ?ASCVD risk elevated ?Labs > 61 years old ?Recommend increase strength of statin ?Encouraged to use GoodRx for cost savings ?RTC in 3 months for labs/CPE  ?

## 2022-06-27 NOTE — Progress Notes (Unsigned)
Complete physical exam   Patient: Alexa Thompson   DOB: October 23, 1961   61 y.o. Female  MRN: [id number] Visit Date: 07/02/2022  Today's healthcare provider: Gwyneth Sprout, FNP   No chief complaint on file.  Subjective    Alexa Thompson is a 61 y.o. female who presents today for a complete physical exam.  She reports consuming a {diet types:17450} diet. {Exercise:19826} She generally feels {well/fairly well/poorly:18703}. She reports sleeping {well/fairly well/poorly:18703}. She {does/does not:200015} have additional problems to discuss today.  HPI  ***  Past Medical History:  Diagnosis Date   Allergy    Heart murmur    Hypertension    Substance abuse [facility])    Past Surgical History:  Procedure Laterality Date   APPENDECTOMY  age 80ish   CESAREAN SECTION  age 55   COLONOSCOPY WITH PROPOFOL N/A 05/11/2019   Procedure: COLONOSCOPY WITH PROPOFOL;  Surgeon: Jonathon Bellows, MD;  Location[facility] ENDOSCOPY;  Service: Gastroenterology;  Laterality: N/A;   ESOPHAGOGASTRODUODENOSCOPY (EGD) WITH PROPOFOL N/A 05/11/2019   Procedure: ESOPHAGOGASTRODUODENOSCOPY (EGD) WITH PROPOFOL;  Surgeon: Jonathon Bellows, MD;  Location: [facility] ENDOSCOPY;  Service: Gastroenterology;  Laterality: N/A;   HERNIA REPAIR  age 69ish   Social History   Socioeconomic History   Marital status: Single    Spouse name: Not on file   Number of children: Not on file   Years of education: Not on file   Highest education level: Not on file  Occupational History   Not on file  Tobacco Use   Smoking status: Every Day    Packs/day: 1.50    Years: 45.00    Total pack years: 67.50    Types: Cigarettes   Smokeless tobacco: Never  Substance and Sexual Activity   Alcohol use: Yes    Alcohol/week: 4.0 standard drinks of alcohol    Types: 4 Cans of beer per week    Comment: previous heavy drinker October, 2016   Drug use: Yes    Types: "Crack" cocaine, Marijuana, Benzodiazepines, Other-see comments    Comment: recovering.  last use of Cocaine was February 01, 2016   Sexual activity: Not Currently  Other Topics Concern   Not on file  Social History Narrative   Not on file   Social Determinants of Health   Financial Resource Strain: Not on file  Food Insecurity: Not on file  Transportation Needs: Not on file  Physical Activity: Sufficiently Active (05/03/2019)   Exercise Vital Sign    Days of Exercise per Week: 4 days    Minutes of Exercise per Session: 60 min  Stress: No Stress Concern Present (05/03/2019)   [place]    Feeling of Stress : Not at all  Social Connections: Moderately Isolated (05/03/2019)   Social Connection and Isolation Panel [NHANES]    Frequency of Communication with Friends and Family: More than three times a week    Frequency of Social Gatherings with Friends and Family: Three times a week    Attends Religious Services: 1 to 4 times per year    Active Member of Clubs or Organizations: No    Attends Archivist Meetings: Never    Marital Status: Divorced  Human resources officer Violence: Not At Risk (05/03/2019)   Humiliation, Afraid, Rape, and Kick questionnaire    Fear of Current or Ex-Partner: No    Emotionally Abused: No    Physically Abused: No    Sexually Abused: No  Family Status  Relation Name Status   Mother  Deceased   Father  Alive   Neg Hx  (Not Specified)   Family History  Problem Relation Age of Onset   Kidney disease Mother    COPD Mother    Hypertension Mother    Diabetes Father    Heart disease Father        CHF   Breast cancer Neg Hx    No Known Allergies  Patient Care Team: Gwyneth Sprout, FNP as PCP - General (Family Medicine)   Medications: Outpatient Medications Prior to Visit  Medication Sig   Budeson-Glycopyrrol-Formoterol (BREZTRI AEROSPHERE) 160-9-4.8 MCG/ACT AERO Inhale 2 puffs into the lungs 2 (two) times daily.   clotrimazole-betamethasone (LOTRISONE) cream Apply 1  application topically 2 (two) times daily. (Patient not taking: Reported on 04/03/2022)   losartan-hydrochlorothiazide (HYZAAR) 50-12.5 MG tablet Take 1 tablet by mouth daily.   rosuvastatin (CRESTOR) 10 MG tablet Take 1 tablet (10 mg total) by mouth daily.   No facility-administered medications prior to visit.    Review of Systems  All other systems reviewed and are negative.   Last CBC Lab Results  Component Value Date   WBC 3.7 04/11/2020   HGB 13.2 04/11/2020   HCT 37.0 04/11/2020   MCV 98 (H) 04/11/2020   MCH 35.1 (H) 04/11/2020   RDW 12.6 04/11/2020   PLT 141 (L) 05/17/1600   Last metabolic panel Lab Results  Component Value Date   GLUCOSE 110 (H) 04/11/2020   NA 139 04/11/2020   K 4.1 04/11/2020   CL 103 04/11/2020   CO2 19 (L) 04/11/2020   BUN 12 04/11/2020   CREATININE 0.87 04/11/2020   GFRNONAA 73 04/11/2020   CALCIUM 10.0 04/11/2020   PROT 7.8 04/11/2020   ALBUMIN 4.8 04/11/2020   LABGLOB 3.0 04/11/2020   AGRATIO 1.6 04/11/2020   BILITOT 0.5 04/11/2020   ALKPHOS 85 04/11/2020   AST 36 04/11/2020   ALT 21 04/11/2020   ANIONGAP 5 (L) 12/08/2013   Last lipids Lab Results  Component Value Date   CHOL 240 (H) 04/11/2020   HDL 91 04/11/2020   LDLCALC 136 (H) 04/11/2020   TRIG 79 04/11/2020   CHOLHDL 2.6 04/11/2020   Last hemoglobin A1c Lab Results  Component Value Date   HGBA1C 5.7 (H) 02/15/2016   Last thyroid functions Lab Results  Component Value Date   TSH 0.876 04/11/2020      Objective    LMP  (LMP Unknown)  BP Readings from Last 3 Encounters:  04/03/22 (!) 152/81  07/17/20 (!) 154/90  05/29/20 (!) 160/96   Wt Readings from Last 3 Encounters:  04/03/22 127 lb 1.6 oz (57.7 kg)  07/17/20 145 lb 6.4 oz (66 kg)  05/29/20 140 lb (63.5 kg)       Physical Exam  ***  Last depression screening scores    04/03/2022    8:34 AM 07/17/2020    4:10 PM 04/11/2020   10:24 AM  PHQ 2/9 Scores  PHQ - 2 Score 0 0 2  PHQ- 9 Score 4 0 4    Last fall risk screening    04/11/2020   10:24 AM  [place] in the past year? 0  Number falls in past yr: 0  Injury with Fall? 0   Last Audit-C alcohol use screening    04/11/2020   10:24 AM  Alcohol Use Disorder Test (AUDIT)  1. How often do you have a drink containing  alcohol? 4  2. How many drinks containing alcohol do you have on a typical day when you are drinking? 2  3. How often do you have six or more drinks on one occasion? 4  AUDIT-C Score 10  4. How often during the last year have you found that you were not able to stop drinking once you had started? 0  5. How often during the last year have you failed to do what was normally expected from you because of drinking? 0  6. How often during the last year have you needed a first drink in the morning to get yourself going after a heavy drinking session? 0  7. How often during the last year have you had a feeling of guilt of remorse after drinking? 0  8. How often during the last year have you been unable to remember what happened the night before because you had been drinking? 0  9. Have you or someone else been injured as a result of your drinking? 0  10. Has a relative or friend or a doctor or another health worker been concerned about your drinking or suggested you cut down? 0  Alcohol Use Disorder Identification Test Final Score (AUDIT) 10   A score of 3 or more in women, and 4 or more in men indicates increased risk for alcohol abuse, EXCEPT if all of the points are from question 1   No results found for any visits on 07/02/22.  Assessment & Plan    Routine Health Maintenance and Physical Exam  Exercise Activities and Dietary recommendations  Goals       " I want to manage my depression and alcohol use better" (pt-stated)      Current Barriers:  Mental Health Concerns  Substance abuse issues - alcoholism  Clinical Social Work Clinical Goal(s):  Over the next 90 days, client will work with SW to address  concerns related to her depression and alcohol use  Interventions: Patient interviewed and appropriate assessments performed Continued to provide mental health counseling with regard to anxiety and stress related to work environment and possible COVID exposures Allowed patient to vent her frustrations, normalized her fears and explored possible coping strategies Continued to encourage self care practices and personal responsibilities (wearing mask, washing hands regularly, keeping safe distances from others) Discussed plans with patient for ongoing care management follow up and provided patient with direct contact information for care management team   Patient Self Care Activities:  Attends all scheduled provider appointments Performs ADL's independently Performs IADL's independently  Please see past updates related to this goal by clicking on the "Past Updates" button in the selected goal          Immunization History  Administered Date(s) Administered   Influenza,inj,Quad PF,6+ Mos 10/18/2019   PFIZER(Purple Top)SARS-COV-2 Vaccination 02/26/2020, 03/24/2020    Health Maintenance  Topic Date Due   TETANUS/TDAP  Never done   Zoster Vaccines- Shingrix (1 of 2) Never done   COVID-19 Vaccine (3 - Pfizer series) 05/19/2020   MAMMOGRAM  07/04/2021   COLONOSCOPY (Pts 45-31yr Insurance coverage will need to be confirmed)  05/10/2022   INFLUENZA VACCINE  06/18/2022   PAP SMEAR-Modifier  04/12/2023   Hepatitis C Screening  Completed   HIV Screening  Completed   HPV VACCINES  Aged Out    Discussed health benefits of physical activity, and encouraged her to engage in regular exercise appropriate for her age and condition.  ***  No follow-ups on file.     {  provider attestation***:1}   Gwyneth Sprout, [place] [phone] (phone) [phone] (fax)  [place]

## 2022-07-01 NOTE — Patient Instructions (Incomplete)
[email or web address]://[email or web address].com/savings-[email or web address]-coupons/  ree GoodRx coupon $ 86.27 Free to use. No sign-up required. $44981% off retail price BIN [id number] PCN [id number] [id number] Member ID [id number]

## 2022-07-02 ENCOUNTER — Encounter: Payer: Self-pay | Admitting: Family Medicine

## 2022-07-02 ENCOUNTER — Ambulatory Visit: Payer: Self-pay | Admitting: Family Medicine

## 2022-07-02 VITALS — BP 125/83 | HR 79 | Temp 98.2°F | Resp 16 | Ht 64.0 in | Wt 131.0 lb

## 2022-07-02 DIAGNOSIS — I10 Essential (primary) hypertension: Secondary | ICD-10-CM

## 2022-07-02 DIAGNOSIS — Z1231 Encounter for screening mammogram for malignant neoplasm of breast: Secondary | ICD-10-CM

## 2022-07-02 DIAGNOSIS — J449 Chronic obstructive pulmonary disease, unspecified: Secondary | ICD-10-CM

## 2022-07-02 DIAGNOSIS — Z0000 Encounter for general adult medical examination without abnormal findings: Secondary | ICD-10-CM

## 2022-07-02 DIAGNOSIS — Z1211 Encounter for screening for malignant neoplasm of colon: Secondary | ICD-10-CM

## 2022-07-02 DIAGNOSIS — Z23 Encounter for immunization: Secondary | ICD-10-CM

## 2022-07-02 DIAGNOSIS — E7800 Pure hypercholesterolemia, unspecified: Secondary | ICD-10-CM

## 2022-07-02 MED ORDER — FLUTICASONE-SALMETEROL 250-50 MCG/ACT IN AEPB: 1.0000 | INHALATION_SPRAY | Freq: Two times a day (BID) | RESPIRATORY_TRACT | 11 refills | Status: DC

## 2022-07-02 NOTE — Assessment & Plan Note (Signed)
Chronic, unknown Check LP No complications with use of Crestor 10 mg Continue to recommend healthy diet, exercise-purposeful, and smoking reduction

## 2022-07-02 NOTE — Assessment & Plan Note (Signed)
Chronic, stable No complications with Hyzaar 50-12.5 BP at goal Check CMP

## 2022-07-02 NOTE — Assessment & Plan Note (Signed)
Chronic, stable Intolerance to Breztri Has not established insurance Has not quit tobacco Request for previous inhaler with GoodRx discount

## 2022-07-03 LAB — COMPREHENSIVE METABOLIC PANEL
ALT: 24 IU/L (ref 0–32)
AST: 38 IU/L (ref 0–40)
Albumin/Globulin Ratio: 1.9 (ref 1.2–2.2)
Albumin: 5.1 g/dL — ABNORMAL HIGH (ref 3.9–4.9)
Alkaline Phosphatase: 63 IU/L (ref 44–121)
BUN/Creatinine Ratio: 19 (ref 12–28)
BUN: 17 mg/dL (ref 8–27)
Bilirubin Total: 0.4 mg/dL (ref 0.0–1.2)
CO2: 21 mmol/L (ref 20–29)
Calcium: 9.7 mg/dL (ref 8.7–10.3)
Chloride: 100 mmol/L (ref 96–106)
Creatinine, Ser: 0.91 mg/dL (ref 0.57–1.00)
Globulin, Total: 2.7 g/dL (ref 1.5–4.5)
Glucose: 99 mg/dL (ref 70–99)
Potassium: 4.8 mmol/L (ref 3.5–5.2)
Sodium: 140 mmol/L (ref 134–144)
Total Protein: 7.8 g/dL (ref 6.0–8.5)
eGFR: 72 mL/min/{1.73_m2} (ref >59)

## 2022-07-03 LAB — LIPID PANEL WITH LDL/HDL RATIO
Cholesterol, Total: 235 mg/dL — ABNORMAL HIGH (ref 100–199)
HDL: 127 mg/dL (ref >39)
LDL Chol Calc (NIH): 87 mg/dL (ref 0–99)
LDL/HDL Ratio: 0.7 ratio (ref 0.0–3.2)
Triglycerides: 131 mg/dL (ref 0–149)
VLDL Cholesterol Cal: 21 mg/dL (ref 5–40)

## 2022-07-03 NOTE — Progress Notes (Signed)
Stable blood chemistry; improved LDL at 87 from previous labs of 136. Total remains elevated at 235. I continue to recommend diet low in saturated fat and regular exercise - 30 min at least 5 times per week.  Gwyneth Sprout, [place] [address] [place], [place] [address] [phone] (phone) [phone] (fax) [place]

## 2022-11-26 ENCOUNTER — Encounter: Payer: Self-pay | Admitting: Family Medicine

## 2022-12-05 ENCOUNTER — Encounter: Payer: Self-pay | Admitting: Family Medicine

## 2022-12-19 ENCOUNTER — Other Ambulatory Visit: Payer: Self-pay | Admitting: Family Medicine

## 2022-12-19 ENCOUNTER — Ambulatory Visit (INDEPENDENT_AMBULATORY_CARE_PROVIDER_SITE_OTHER): Payer: BC Managed Care – PPO | Admitting: Family Medicine

## 2022-12-19 ENCOUNTER — Encounter: Payer: Self-pay | Admitting: *Deleted

## 2022-12-19 ENCOUNTER — Encounter: Payer: Self-pay | Admitting: Family Medicine

## 2022-12-19 VITALS — BP 125/78 | HR 79 | Temp 99.5°F | Ht 64.0 in | Wt 133.0 lb

## 2022-12-19 DIAGNOSIS — F17210 Nicotine dependence, cigarettes, uncomplicated: Secondary | ICD-10-CM

## 2022-12-19 DIAGNOSIS — Z1329 Encounter for screening for other suspected endocrine disorder: Secondary | ICD-10-CM | POA: Diagnosis not present

## 2022-12-19 DIAGNOSIS — F1010 Alcohol abuse, uncomplicated: Secondary | ICD-10-CM

## 2022-12-19 DIAGNOSIS — I10 Essential (primary) hypertension: Secondary | ICD-10-CM

## 2022-12-19 DIAGNOSIS — E7800 Pure hypercholesterolemia, unspecified: Secondary | ICD-10-CM

## 2022-12-19 DIAGNOSIS — E038 Other specified hypothyroidism: Secondary | ICD-10-CM

## 2022-12-19 DIAGNOSIS — Z0000 Encounter for general adult medical examination without abnormal findings: Secondary | ICD-10-CM | POA: Diagnosis not present

## 2022-12-19 DIAGNOSIS — Z1231 Encounter for screening mammogram for malignant neoplasm of breast: Secondary | ICD-10-CM

## 2022-12-19 DIAGNOSIS — Z860100 Personal history of colon polyps, unspecified: Secondary | ICD-10-CM

## 2022-12-19 DIAGNOSIS — Z8619 Personal history of other infectious and parasitic diseases: Secondary | ICD-10-CM

## 2022-12-19 MED ORDER — ALBUTEROL SULFATE HFA 108 (90 BASE) MCG/ACT IN AERS: 2.0000 | INHALATION_SPRAY | Freq: Four times a day (QID) | RESPIRATORY_TRACT | 2 refills | Status: DC | PRN

## 2022-12-19 NOTE — Progress Notes (Signed)
Complete physical exam   Patient: Alexa Thompson   DOB: 11-11-1961   62 y.o. Female  MRN: [id number] Visit Date: 12/19/2022  Today's healthcare provider: Gwyneth Sprout, FNP  Introduced to nurse practitioner role and practice setting.  All questions answered.  Discussed provider/patient relationship and expectations.  No chief complaint on file.  Subjective    Alexa Thompson is a 62 y.o. female who presents today for a complete physical exam.  She reports consuming a general diet. The patient does not participate in regular exercise at present. She generally feels fairly well. She reports sleeping fairly well. She does not have additional problems to discuss today.  HPI HPI   Annual physical Last edited by Elta Guadeloupe, CMA on 12/19/2022  2:00 PM.       Past Medical History:  Diagnosis Date   Allergy    Heart murmur    Hypertension    Substance abuse [facility])    Past Surgical History:  Procedure Laterality Date   APPENDECTOMY  age 19ish   CESAREAN SECTION  age 54   COLONOSCOPY WITH PROPOFOL N/A 05/11/2019   Procedure: COLONOSCOPY WITH PROPOFOL;  Surgeon: Jonathon Bellows, MD;  Location: [facility] ENDOSCOPY;  Service: Gastroenterology;  Laterality: N/A;   ESOPHAGOGASTRODUODENOSCOPY (EGD) WITH PROPOFOL N/A 05/11/2019   Procedure: ESOPHAGOGASTRODUODENOSCOPY (EGD) WITH PROPOFOL;  Surgeon: Jonathon Bellows, MD;  Location: [facility] ENDOSCOPY;  Service: Gastroenterology;  Laterality: N/A;   HERNIA REPAIR  age 49ish   Social History   Socioeconomic History   Marital status: Single    Spouse name: Not on file   Number of children: Not on file   Years of education: Not on file   Highest education level: Not on file  Occupational History   Not on file  Tobacco Use   Smoking status: Every Day    Packs/day: 1.50    Years: 45.00    Total pack years: 67.50    Types: Cigarettes   Smokeless tobacco: Never  Substance and Sexual Activity   Alcohol use: Yes    Alcohol/week: 4.0 standard drinks of  alcohol    Types: 4 Cans of beer per week    Comment: previous heavy drinker October, 2016   Drug use: Not Currently    Types: "Crack" cocaine, Marijuana, Benzodiazepines, Other-see comments    Comment: recovering. last use of Cocaine was February 01, 2016   Sexual activity: Not Currently  Other Topics Concern   Not on file  Social History Narrative   Not on file   Social Determinants of Health   Financial Resource Strain: Not on file  Food Insecurity: Not on file  Transportation Needs: Not on file  Physical Activity: Sufficiently Active (05/03/2019)   Exercise Vital Sign    Days of Exercise per Week: 4 days    Minutes of Exercise per Session: 60 min  Stress: No Stress Concern Present (05/03/2019)   [place]    Feeling of Stress : Not at all  Social Connections: Moderately Isolated (05/03/2019)   Social Connection and Isolation Panel [NHANES]    Frequency of Communication with Friends and Family: More than three times a week    Frequency of Social Gatherings with Friends and Family: Three times a week    Attends Religious Services: 1 to 4 times per year    Active Member of Clubs or Organizations: No    Attends Archivist Meetings: Never    Marital  Status: Divorced  Human resources officer Violence: Not At Risk (05/03/2019)   Humiliation, Afraid, Rape, and Kick questionnaire    Fear of Current or Ex-Partner: No    Emotionally Abused: No    Physically Abused: No    Sexually Abused: No   Family Status  Relation Name Status   Mother  Deceased   Father  Alive   Neg Hx  (Not Specified)   Family History  Problem Relation Age of Onset   Kidney disease Mother    COPD Mother    Hypertension Mother    Diabetes Father    Heart disease Father        CHF   Breast cancer Neg Hx    Allergies  Allergen Reactions   Breztri Aerosphere [Budeson-Glycopyrrol-Formoterol] Other (See Comments)    Burning in chest/airway     Patient Care Team: Gwyneth Sprout, FNP as PCP - General (Family Medicine)   Medications: Outpatient Medications Prior to Visit  Medication Sig   clotrimazole-betamethasone (LOTRISONE) cream Apply 1 application topically 2 (two) times daily.   losartan-hydrochlorothiazide (HYZAAR) 50-12.5 MG tablet Take 1 tablet by mouth daily.   rosuvastatin (CRESTOR) 10 MG tablet Take 1 tablet (10 mg total) by mouth daily.   [DISCONTINUED] fluticasone-salmeterol (WIXELA INHUB) 250-50 MCG/ACT AEPB Inhale 1 puff into the lungs in the morning and at bedtime.   No facility-administered medications prior to visit.    Review of Systems  Last CBC Lab Results  Component Value Date   WBC 5.1 12/19/2022   HGB 12.8 12/19/2022   HCT 37.2 12/19/2022   MCV 98 (H) 12/19/2022   MCH 33.7 (H) 12/19/2022   RDW 13.8 12/19/2022   PLT 170 09/14/2535   Last metabolic panel Lab Results  Component Value Date   GLUCOSE 94 12/19/2022   NA 137 12/19/2022   K 4.4 12/19/2022   CL 97 12/19/2022   CO2 22 12/19/2022   BUN 21 12/19/2022   CREATININE 0.95 12/19/2022   EGFR 68 12/19/2022   CALCIUM 10.3 12/19/2022   PROT 7.6 12/19/2022   ALBUMIN 4.9 12/19/2022   LABGLOB 2.7 12/19/2022   AGRATIO 1.8 12/19/2022   BILITOT 0.2 12/19/2022   ALKPHOS 70 12/19/2022   AST 22 12/19/2022   ALT 15 12/19/2022   ANIONGAP 5 (L) 12/08/2013   Last lipids Lab Results  Component Value Date   CHOL 226 (H) 12/19/2022   HDL 107 12/19/2022   LDLCALC 85 12/19/2022   TRIG 211 (H) 12/19/2022   CHOLHDL 2.1 12/19/2022   Last hemoglobin A1c Lab Results  Component Value Date   HGBA1C 5.7 (H) 12/19/2022   Last thyroid functions Lab Results  Component Value Date   TSH 0.806 12/19/2022      Objective    BP 125/78 (BP Location: Right Arm, Patient Position: Sitting, Cuff Size: Normal)   Pulse 79   Temp 99.5 F (37.5 C)   Ht '5\' 4"'$  (1.626 m)   Wt 133 lb (60.3 kg)   LMP  (LMP Unknown)   SpO2 96%   BMI 22.83 kg/m   BP  Readings from Last 3 Encounters:  12/19/22 125/78  07/02/22 125/83  04/03/22 (!) 152/81   Wt Readings from Last 3 Encounters:  12/19/22 133 lb (60.3 kg)  07/02/22 131 lb (59.4 kg)  04/03/22 127 lb 1.6 oz (57.7 kg)   SpO2 Readings from Last 3 Encounters:  12/19/22 96%  07/02/22 94%  05/29/20 95%       Physical Exam Vitals and nursing  note reviewed.  Constitutional:      General: She is awake. She is not in acute distress.    Appearance: Normal appearance. She is well-developed, well-groomed and normal weight. She is not ill-appearing, toxic-appearing or diaphoretic.  HENT:     Head: Normocephalic and atraumatic.     Jaw: There is normal jaw occlusion. No trismus, tenderness, swelling or pain on movement.     Right Ear: Hearing, tympanic membrane, ear canal and external ear normal. There is no impacted cerumen.     Left Ear: Hearing, tympanic membrane, ear canal and external ear normal. There is no impacted cerumen.     Nose: Nose normal. No congestion or rhinorrhea.     Right Turbinates: Not enlarged, swollen or pale.     Left Turbinates: Not enlarged, swollen or pale.     Right Sinus: No maxillary sinus tenderness or frontal sinus tenderness.     Left Sinus: No maxillary sinus tenderness or frontal sinus tenderness.     Mouth/Throat:     Lips: Pink.     Mouth: Mucous membranes are moist. No injury.     Tongue: No lesions.     Pharynx: Oropharynx is clear. Uvula midline. No pharyngeal swelling, oropharyngeal exudate, posterior oropharyngeal erythema or uvula swelling.     Tonsils: No tonsillar exudate or tonsillar abscesses.  Eyes:     General: Lids are normal. Lids are everted, no foreign bodies appreciated. Vision grossly intact. Gaze aligned appropriately. No allergic shiner or visual field deficit.       Right eye: No discharge.        Left eye: No discharge.     Extraocular Movements: Extraocular movements intact.     Conjunctiva/sclera: Conjunctivae normal.     Right  eye: Right conjunctiva is not injected. No exudate.    Left eye: Left conjunctiva is not injected. No exudate.    Pupils: Pupils are equal, round, and reactive to light.  Neck:     Thyroid: No thyroid mass, thyromegaly or thyroid tenderness.     Vascular: No carotid bruit.     Trachea: Trachea normal.  Cardiovascular:     Rate and Rhythm: Normal rate and regular rhythm.     Pulses: Normal pulses.          Carotid pulses are 2+ on the right side and 2+ on the left side.      Radial pulses are 2+ on the right side and 2+ on the left side.       Dorsalis pedis pulses are 2+ on the right side and 2+ on the left side.       Posterior tibial pulses are 2+ on the right side and 2+ on the left side.     Heart sounds: Normal heart sounds, S1 normal and S2 normal. No murmur heard.    No friction rub. No gallop.  Pulmonary:     Effort: Pulmonary effort is normal. No respiratory distress.     Breath sounds: Normal breath sounds and air entry. No stridor. No wheezing, rhonchi or rales.  Chest:     Chest wall: No tenderness.     Comments: Breasts: breasts appear normal, no suspicious masses, no skin or nipple changes or axillary nodes, right breast normal without mass, skin or nipple changes or axillary nodes, left breast normal without mass, skin or nipple changes or axillary nodes, risk and benefit of breast self-exam was discussed  Abdominal:     General: Abdomen is flat. Bowel sounds  are normal. There is no distension.     Palpations: Abdomen is soft. There is no mass.     Tenderness: There is no abdominal tenderness. There is no right CVA tenderness, left CVA tenderness, guarding or rebound.     Hernia: No hernia is present.  Genitourinary:    Comments: Exam deferred; denies complaints Musculoskeletal:        General: No swelling, tenderness, deformity or signs of injury. Normal range of motion.     Cervical back: Full passive range of motion without pain, normal range of motion and neck  supple. No edema, rigidity or tenderness. No muscular tenderness.     Right lower leg: No edema.     Left lower leg: No edema.  Lymphadenopathy:     Cervical: No cervical adenopathy.     Right cervical: No superficial, deep or posterior cervical adenopathy.    Left cervical: No superficial, deep or posterior cervical adenopathy.  Skin:    General: Skin is warm and dry.     Capillary Refill: Capillary refill takes less than 2 seconds.     Coloration: Skin is not jaundiced or pale.     Findings: No bruising, erythema, lesion or rash.  Neurological:     General: No focal deficit present.     Mental Status: She is alert and oriented to person, place, and time. Mental status is at baseline.     GCS: GCS eye subscore is 4. GCS verbal subscore is 5. GCS motor subscore is 6.     Sensory: Sensation is intact. No sensory deficit.     Motor: Motor function is intact. No weakness.     Coordination: Coordination is intact. Coordination normal.     Gait: Gait is intact. Gait normal.  Psychiatric:        Attention and Perception: Attention and perception normal.        Mood and Affect: Mood and affect normal.        Speech: Speech normal.        Behavior: Behavior normal. Behavior is cooperative.        Thought Content: Thought content normal.        Cognition and Memory: Cognition and memory normal.        Judgment: Judgment normal.     Last depression screening scores    12/19/2022    2:03 PM 07/02/2022    9:11 AM 04/03/2022    8:34 AM  PHQ 2/9 Scores  PHQ - 2 Score 0 0 0  PHQ- 9 Score '2 2 4   '$ Last fall risk screening    07/02/2022    9:11 AM  [place] in the past year? 0  Number falls in past yr: 0  Injury with Fall? 0   Last Audit-C alcohol use screening    12/19/2022    2:03 PM  Alcohol Use Disorder Test (AUDIT)  1. How often do you have a drink containing alcohol? 4  2. How many drinks containing alcohol do you have on a typical day when you are drinking? 1  3. How  often do you have six or more drinks on one occasion? 1  AUDIT-C Score 6   A score of 3 or more in women, and 4 or more in men indicates increased risk for alcohol abuse, EXCEPT if all of the points are from question 1   Results for orders placed or performed in visit on 12/19/22  CBC with Differential/Platelet  Result  Value Ref Range   WBC 5.1 3.4 - 10.8 x10E3/uL   RBC 3.80 3.77 - 5.28 x10E6/uL   Hemoglobin 12.8 11.1 - 15.9 g/dL   Hematocrit 37.2 34.0 - 46.6 %   MCV 98 (H) 79 - 97 fL   MCH 33.7 (H) 26.6 - 33.0 pg   MCHC 34.4 31.5 - 35.7 g/dL   RDW 13.8 11.7 - 15.4 %   Platelets 170 150 - 450 x10E3/uL   Neutrophils 53 Not Estab. %   Lymphs 35 Not Estab. %   Monocytes 9 Not Estab. %   Eos 2 Not Estab. %   Basos 1 Not Estab. %   Neutrophils Absolute 2.7 1.4 - 7.0 x10E3/uL   Lymphocytes Absolute 1.8 0.7 - 3.1 x10E3/uL   Monocytes Absolute 0.5 0.1 - 0.9 x10E3/uL   EOS (ABSOLUTE) 0.1 0.0 - 0.4 x10E3/uL   Basophils Absolute 0.0 0.0 - 0.2 x10E3/uL   Immature Granulocytes 0 Not Estab. %   Immature Grans (Abs) 0.0 0.0 - 0.1 x10E3/uL  Comprehensive Metabolic Panel (CMET)  Result Value Ref Range   Glucose 94 70 - 99 mg/dL   BUN 21 8 - 27 mg/dL   Creatinine, Ser 0.95 0.57 - 1.00 mg/dL   eGFR 68 >59 mL/min/1.73   BUN/Creatinine Ratio 22 12 - 28   Sodium 137 134 - 144 mmol/L   Potassium 4.4 3.5 - 5.2 mmol/L   Chloride 97 96 - 106 mmol/L   CO2 22 20 - 29 mmol/L   Calcium 10.3 8.7 - 10.3 mg/dL   Total Protein 7.6 6.0 - 8.5 g/dL   Albumin 4.9 3.9 - 4.9 g/dL   Globulin, Total 2.7 1.5 - 4.5 g/dL   Albumin/Globulin Ratio 1.8 1.2 - 2.2   Bilirubin Total 0.2 0.0 - 1.2 mg/dL   Alkaline Phosphatase 70 44 - 121 IU/L   AST 22 0 - 40 IU/L   ALT 15 0 - 32 IU/L  Lipid panel  Result Value Ref Range   Cholesterol, Total 226 (H) 100 - 199 mg/dL   Triglycerides 211 (H) 0 - 149 mg/dL   HDL 107 >39 mg/dL   VLDL Cholesterol Cal 34 5 - 40 mg/dL   LDL Chol Calc (NIH) 85 0 - 99 mg/dL   Chol/HDL  Ratio 2.1 0.0 - 4.4 ratio  Hemoglobin A1c  Result Value Ref Range   Hgb A1c MFr Bld 5.7 (H) 4.8 - 5.6 %   Est. average glucose Bld gHb Est-mCnc 117 mg/dL  TSH  Result Value Ref Range   TSH 0.806 0.450 - 4.500 uIU/mL    Assessment & Plan    Routine Health Maintenance and Physical Exam  Exercise Activities and Dietary recommendations  Goals       " I want to manage my depression and alcohol use better" (pt-stated)      Current Barriers:  Mental Health Concerns  Substance abuse issues - alcoholism  Clinical Social Work Clinical Goal(s):  Over the next 90 days, client will work with SW to address concerns related to her depression and alcohol use  Interventions: Patient interviewed and appropriate assessments performed Continued to provide mental health counseling with regard to anxiety and stress related to work environment and possible COVID exposures Allowed patient to vent her frustrations, normalized her fears and explored possible coping strategies Continued to encourage self care practices and personal responsibilities (wearing mask, washing hands regularly, keeping safe distances from others) Discussed plans with patient for ongoing care management follow up and provided patient  with direct contact information for care management team   Patient Self Care Activities:  Attends all scheduled provider appointments Performs ADL's independently Performs IADL's independently  Please see past updates related to this goal by clicking on the "Past Updates" button in the selected goal          Immunization History  Administered Date(s) Administered   Influenza,inj,Quad PF,6+ Mos 10/18/2019   PFIZER(Purple Top)SARS-COV-2 Vaccination 02/26/2020, 03/24/2020    Health Maintenance  Topic Date Due   DTaP/Tdap/Td (1 - Tdap) Never done   Zoster Vaccines- Shingrix (1 of 2) Never done   Lung Cancer Screening  04/20/2021   MAMMOGRAM  07/04/2021   COLONOSCOPY (Pts 45-4yr  Insurance coverage will need to be confirmed)  05/10/2022   COVID-19 Vaccine (3 - 2023-24 season) 07/19/2022   INFLUENZA VACCINE  02/16/2023 (Originally 06/18/2022)   PAP SMEAR-Modifier  04/12/2023   Hepatitis C Screening  Completed   HIV Screening  Completed   HPV VACCINES  Aged Out    Discussed health benefits of physical activity, and encouraged her to engage in regular exercise appropriate for her age and condition.  Problem List Items Addressed This Visit       Cardiovascular and Mediastinum   Essential hypertension, benign    Chronic, stable  Continue Hyzaar 50-12.5 At goal <130/<80        Other   Alcohol abuse    Chronic, improving Made it 65 days sober; congratulated Continued to work on reduction Declines assistance at this time       Annual physical exam - Primary    Wishes to defer PAP Due for mammogram Due for colon cancer screening Things to do to keep yourself healthy  - Exercise at least 30-45 minutes a day, 3-4 days a week.  - Eat a low-fat diet with lots of fruits and vegetables, up to 7-9 servings per day.  - Seatbelts can save your life. Wear them always.  - Smoke detectors on every level of your home, check batteries every year.  - Eye Doctor - have an eye exam every 1-2 years  - Safe sex - if you may be exposed to STDs, use a condom.  - Alcohol -  If you drink, do it moderately, less than 2 drinks per day.  - [place] Choose someone to speak for you if you are not able.  - Depression is common in our stressful world.If you're feeling down or losing interest in things you normally enjoy, please come in for a visit.  - Violence - If anyone is threatening or hurting you, please call immediately.       Relevant Orders   CBC with Differential/Platelet (Completed)   Comprehensive Metabolic Panel (CMET) (Completed)   Lipid panel (Completed)   Hemoglobin A1c (Completed)   Cigarette nicotine dependence without complication     Chronic, improving Continue to recommend goal of cessation On BP and HLD treatment Continue Wixela as needed       Relevant Medications   albuterol (VENTOLIN HFA) 108 (90 Base) MCG/ACT inhaler   Other Relevant Orders   CT CHEST LUNG CA SCREEN LOW DOSE W/O CM   Elevated LDL cholesterol level    Chronic, previously elevated Crestor 10 mg previously Goal <70 LDL      History of colon polyps    Due for colon cancer screening; referral to GI       Relevant Orders   Ambulatory referral to Gastroenterology   History of  HPV infection    Wishes to defer PAP at this time; reports no symptoms Previous high risk HPV positive without cellular changes       Screening for thyroid disorder    Denies symptoms; however, hx of HTN      Relevant Orders   TSH (Completed)   Screening mammogram for breast cancer    Normal breast exam; due for mammogram       Relevant Orders   MM 3D SCREEN BREAST BILATERAL   Return in about 6 months (around 06/19/2023) for chonic disease management.    Vonna Kotyk, FNP, have reviewed all documentation for this visit. The documentation on 12/23/22 for the exam, diagnosis, procedures, and orders are all accurate and complete.  Gwyneth Sprout, [place] [phone] (phone) [phone] (fax)  [place]

## 2022-12-19 NOTE — Patient Instructions (Signed)
The CDC recommends two doses of Shingrix (the new shingles vaccine) separated by 2 to 6 months for adults age 62 years and older. I recommend checking with your insurance plan regarding coverage for this vaccine.    Please call and schedule your mammogram:  [facility]  [address], [address] [facility] [place],  [place]  [address] Get Driving Directions Main: [phone]  Sunday:Closed Monday:7:20 AM - 5:00 PM Tuesday:7:20 AM - 5:00 PM Wednesday:7:20 AM - 5:00 PM Thursday:7:20 AM - 5:00 PM Friday:7:20 AM - 4:30 PM Saturday:Closed  

## 2022-12-20 ENCOUNTER — Other Ambulatory Visit: Payer: Self-pay

## 2022-12-20 ENCOUNTER — Telehealth: Payer: Self-pay | Admitting: Gastroenterology

## 2022-12-20 DIAGNOSIS — Z86010 Personal history of colonic polyps: Secondary | ICD-10-CM

## 2022-12-20 LAB — COMPREHENSIVE METABOLIC PANEL
ALT: 15 IU/L (ref 0–32)
AST: 22 IU/L (ref 0–40)
Albumin/Globulin Ratio: 1.8 (ref 1.2–2.2)
Albumin: 4.9 g/dL (ref 3.9–4.9)
Alkaline Phosphatase: 70 IU/L (ref 44–121)
BUN/Creatinine Ratio: 22 (ref 12–28)
BUN: 21 mg/dL (ref 8–27)
Bilirubin Total: 0.2 mg/dL (ref 0.0–1.2)
CO2: 22 mmol/L (ref 20–29)
Calcium: 10.3 mg/dL (ref 8.7–10.3)
Chloride: 97 mmol/L (ref 96–106)
Creatinine, Ser: 0.95 mg/dL (ref 0.57–1.00)
Globulin, Total: 2.7 g/dL (ref 1.5–4.5)
Glucose: 94 mg/dL (ref 70–99)
Potassium: 4.4 mmol/L (ref 3.5–5.2)
Sodium: 137 mmol/L (ref 134–144)
Total Protein: 7.6 g/dL (ref 6.0–8.5)
eGFR: 68 mL/min/{1.73_m2} (ref >59)

## 2022-12-20 LAB — CBC WITH DIFFERENTIAL/PLATELET
Basophils Absolute: 0 10*3/uL (ref 0.0–0.2)
Basos: 1 %
EOS (ABSOLUTE): 0.1 10*3/uL (ref 0.0–0.4)
Eos: 2 %
Hematocrit: 37.2 % (ref 34.0–46.6)
Hemoglobin: 12.8 g/dL (ref 11.1–15.9)
Immature Grans (Abs): 0 10*3/uL (ref 0.0–0.1)
Immature Granulocytes: 0 %
Lymphocytes Absolute: 1.8 10*3/uL (ref 0.7–3.1)
Lymphs: 35 %
MCH: 33.7 pg — ABNORMAL HIGH (ref 26.6–33.0)
MCHC: 34.4 g/dL (ref 31.5–35.7)
MCV: 98 fL — ABNORMAL HIGH (ref 79–97)
Monocytes Absolute: 0.5 10*3/uL (ref 0.1–0.9)
Monocytes: 9 %
Neutrophils Absolute: 2.7 10*3/uL (ref 1.4–7.0)
Neutrophils: 53 %
Platelets: 170 10*3/uL (ref 150–450)
RBC: 3.8 x10E6/uL (ref 3.77–5.28)
RDW: 13.8 % (ref 11.7–15.4)
WBC: 5.1 10*3/uL (ref 3.4–10.8)

## 2022-12-20 LAB — LIPID PANEL
Chol/HDL Ratio: 2.1 ratio (ref 0.0–4.4)
Cholesterol, Total: 226 mg/dL — ABNORMAL HIGH (ref 100–199)
HDL: 107 mg/dL (ref >39)
LDL Chol Calc (NIH): 85 mg/dL (ref 0–99)
Triglycerides: 211 mg/dL — ABNORMAL HIGH (ref 0–149)
VLDL Cholesterol Cal: 34 mg/dL (ref 5–40)

## 2022-12-20 LAB — HEMOGLOBIN A1C
Est. average glucose Bld gHb Est-mCnc: 117 mg/dL
Hgb A1c MFr Bld: 5.7 % — ABNORMAL HIGH (ref 4.8–5.6)

## 2022-12-20 LAB — TSH: TSH: 0.806 u[IU]/mL (ref 0.450–4.500)

## 2022-12-20 MED ORDER — NA SULFATE-K SULFATE-MG SULF 17.5-3.13-1.6 GM/177ML PO SOLN: 1.0000 | Freq: Once | ORAL | 0 refills | Status: AC

## 2022-12-20 NOTE — Telephone Encounter (Signed)
Patient calling to schedule colonoscopy. Requesting call back.  

## 2022-12-20 NOTE — Telephone Encounter (Signed)
Gastroenterology Pre-Procedure Review  Request Date: 01/13/23 Requesting Physician: Dr. Vicente Males  PATIENT REVIEW QUESTIONS: The patient responded to the following health history questions as indicated:    1. Are you having any GI issues? no 2. Do you have a personal history of Polyps? yes (05/11/19 colonoscopy performed by Dr. Vicente Males) 3. Do you have a family history of Colon Cancer or Polyps? no 4. Diabetes Mellitus? no 5. Joint replacements in the past 12 months?no 6. Major health problems in the past 3 months?no 7. Any artificial heart valves, MVP, or defibrillator?no    MEDICATIONS & ALLERGIES:    Patient reports the following regarding taking any anticoagulation/antiplatelet therapy:   Plavix, Coumadin, Eliquis, Xarelto, Lovenox, Pradaxa, Brilinta, or Effient? no Aspirin? no  Patient confirms/reports the following medications:  Current Outpatient Medications  Medication Sig Dispense Refill   albuterol (VENTOLIN HFA) 108 (90 Base) MCG/ACT inhaler Inhale 2 puffs into the lungs every 6 (six) hours as needed for wheezing or shortness of breath. 8 g 2   clotrimazole-betamethasone (LOTRISONE) cream Apply 1 application topically 2 (two) times daily. 30 g 0   losartan-hydrochlorothiazide (HYZAAR) 50-12.5 MG tablet Take 1 tablet by mouth daily. 90 tablet 3   rosuvastatin (CRESTOR) 10 MG tablet Take 1 tablet (10 mg total) by mouth daily. 90 tablet 3   No current facility-administered medications for this visit.    Patient confirms/reports the following allergies:  Allergies  Allergen Reactions   Breztri Aerosphere [Budeson-Glycopyrrol-Formoterol] Other (See Comments)    Burning in chest/airway    No orders of the defined types were placed in this encounter.   AUTHORIZATION INFORMATION Primary Insurance: 1D#: Group #:  Secondary Insurance: 1D#: Group #:  SCHEDULE INFORMATION: Date: 01/13/23 Time: Location: [facility]

## 2022-12-20 NOTE — Addendum Note (Signed)
Addended by: Vanetta Mulders on: 12/20/2022 12:11 PM   Modules accepted: Orders

## 2022-12-23 ENCOUNTER — Encounter: Payer: Self-pay | Admitting: Family Medicine

## 2022-12-23 DIAGNOSIS — Z1329 Encounter for screening for other suspected endocrine disorder: Secondary | ICD-10-CM | POA: Insufficient documentation

## 2022-12-23 DIAGNOSIS — Z86010 Personal history of colonic polyps: Secondary | ICD-10-CM | POA: Insufficient documentation

## 2022-12-23 DIAGNOSIS — Z8619 Personal history of other infectious and parasitic diseases: Secondary | ICD-10-CM | POA: Insufficient documentation

## 2022-12-23 DIAGNOSIS — Z1231 Encounter for screening mammogram for malignant neoplasm of breast: Secondary | ICD-10-CM | POA: Insufficient documentation

## 2022-12-23 DIAGNOSIS — E038 Other specified hypothyroidism: Secondary | ICD-10-CM | POA: Insufficient documentation

## 2022-12-23 NOTE — Assessment & Plan Note (Signed)
Chronic, stable  Continue Hyzaar 50-12.5 At goal <130/<80

## 2022-12-23 NOTE — Assessment & Plan Note (Signed)
Wishes to defer PAP at this time; reports no symptoms Previous high risk HPV positive without cellular changes

## 2022-12-23 NOTE — Assessment & Plan Note (Signed)
Chronic, improving Made it 65 days sober; congratulated Continued to work on reduction Declines assistance at this time

## 2022-12-23 NOTE — Assessment & Plan Note (Signed)
Chronic, improving Continue to recommend goal of cessation On BP and HLD treatment Continue Wixela as needed

## 2022-12-23 NOTE — Progress Notes (Signed)
Cholesterol remains elevated -total remains above goal as well as elevated fats/triglycerides -LDL remains improved  Pre-diabetes remains stable Continue to recommend balanced, lower carb meals. Smaller meal size, adding snacks. Choosing water as drink of choice and increasing purposeful exercise.  All other labs are stable

## 2022-12-23 NOTE — Assessment & Plan Note (Signed)
Wishes to defer PAP Due for mammogram Due for colon cancer screening Things to do to keep yourself healthy  - Exercise at least 30-45 minutes a day, 3-4 days a week.  - Eat a low-fat diet with lots of fruits and vegetables, up to 7-9 servings per day.  - Seatbelts can save your life. Wear them always.  - Smoke detectors on every level of your home, check batteries every year.  - Eye Doctor - have an eye exam every 1-2 years  - Safe sex - if you may be exposed to STDs, use a condom.  - Alcohol -  If you drink, do it moderately, less than 2 drinks per day.  - [place]. Choose someone to speak for you if you are not able.  - Depression is common in our stressful world.If you're feeling down or losing interest in things you normally enjoy, please come in for a visit.  - Violence - If anyone is threatening or hurting you, please call immediately.

## 2022-12-23 NOTE — Assessment & Plan Note (Signed)
Chronic, previously elevated Crestor 10 mg previously Goal <70 LDL

## 2022-12-23 NOTE — Assessment & Plan Note (Signed)
Normal breast exam; due for mammogram

## 2022-12-23 NOTE — Assessment & Plan Note (Signed)
Due for colon cancer screening; referral to GI

## 2022-12-23 NOTE — Assessment & Plan Note (Signed)
Denies symptoms; however, hx of HTN

## 2023-01-07 ENCOUNTER — Other Ambulatory Visit: Payer: Self-pay

## 2023-01-07 DIAGNOSIS — Z87891 Personal history of nicotine dependence: Secondary | ICD-10-CM

## 2023-01-07 DIAGNOSIS — F17210 Nicotine dependence, cigarettes, uncomplicated: Secondary | ICD-10-CM

## 2023-01-10 ENCOUNTER — Encounter: Payer: Self-pay | Admitting: Gastroenterology

## 2023-01-13 ENCOUNTER — Encounter: Payer: Self-pay | Admitting: Gastroenterology

## 2023-01-13 ENCOUNTER — Ambulatory Visit
Admission: RE | Admit: 2023-01-13 | Discharge: 2023-01-13 | Disposition: A | Discharge status: Home / Self Care | Payer: BC Managed Care – PPO | Attending: Gastroenterology | Admitting: Gastroenterology

## 2023-01-13 ENCOUNTER — Ambulatory Visit: Payer: BC Managed Care – PPO | Admitting: Anesthesiology

## 2023-01-13 ENCOUNTER — Encounter
Admission: RE | Disposition: A | Discharge status: Home / Self Care | Payer: Self-pay | Source: Home / Self Care | Attending: Gastroenterology

## 2023-01-13 DIAGNOSIS — K635 Polyp of colon: Secondary | ICD-10-CM | POA: Diagnosis not present

## 2023-01-13 DIAGNOSIS — I739 Peripheral vascular disease, unspecified: Secondary | ICD-10-CM | POA: Diagnosis not present

## 2023-01-13 DIAGNOSIS — I712 Thoracic aortic aneurysm, without rupture, unspecified: Secondary | ICD-10-CM | POA: Insufficient documentation

## 2023-01-13 DIAGNOSIS — F17210 Nicotine dependence, cigarettes, uncomplicated: Secondary | ICD-10-CM | POA: Diagnosis not present

## 2023-01-13 DIAGNOSIS — Z1211 Encounter for screening for malignant neoplasm of colon: Secondary | ICD-10-CM | POA: Diagnosis not present

## 2023-01-13 DIAGNOSIS — J449 Chronic obstructive pulmonary disease, unspecified: Secondary | ICD-10-CM | POA: Insufficient documentation

## 2023-01-13 DIAGNOSIS — Z86010 Personal history of colonic polyps: Secondary | ICD-10-CM | POA: Diagnosis not present

## 2023-01-13 DIAGNOSIS — I10 Essential (primary) hypertension: Secondary | ICD-10-CM | POA: Insufficient documentation

## 2023-01-13 HISTORY — PX: COLONOSCOPY WITH PROPOFOL: SHX5780

## 2023-01-13 SURGERY — COLONOSCOPY WITH PROPOFOL
Anesthesia: General

## 2023-01-13 MED ORDER — SODIUM CHLORIDE 0.9 % IV SOLN
INTRAVENOUS | Status: DC
  Administered 2023-01-13: 1000 mL via INTRAVENOUS

## 2023-01-13 MED ORDER — PROPOFOL 500 MG/50ML IV EMUL
INTRAVENOUS | Status: DC | PRN
  Administered 2023-01-13: 150 ug/kg/min via INTRAVENOUS

## 2023-01-13 MED ORDER — LIDOCAINE HCL (CARDIAC) PF 100 MG/5ML IV SOSY
PREFILLED_SYRINGE | INTRAVENOUS | Status: DC | PRN
  Administered 2023-01-13: 50 mg via INTRAVENOUS

## 2023-01-13 MED ORDER — DEXMEDETOMIDINE HCL IN NACL 80 MCG/20ML IV SOLN
INTRAVENOUS | Status: DC | PRN
  Administered 2023-01-13: 12 ug via BUCCAL

## 2023-01-13 MED ORDER — PROPOFOL 10 MG/ML IV BOLUS
INTRAVENOUS | Status: DC | PRN
  Administered 2023-01-13: 50 mg via INTRAVENOUS
  Administered 2023-01-13: 30 mg via INTRAVENOUS
  Administered 2023-01-13: 70 mg via INTRAVENOUS

## 2023-01-13 NOTE — Anesthesia Postprocedure Evaluation (Signed)
Anesthesia Post Note  Patient: Alexa Thompson  Procedure(s) Performed: COLONOSCOPY WITH PROPOFOL  Patient location during evaluation: PACU Anesthesia Type: General Level of consciousness: awake and alert, oriented and patient cooperative Pain management: pain level controlled Vital Signs Assessment: post-procedure vital signs reviewed and stable Respiratory status: spontaneous breathing, nonlabored ventilation and respiratory function stable Cardiovascular status: blood pressure returned to baseline and stable Postop Assessment: adequate PO intake Anesthetic complications: no   No notable events documented.   Last Vitals:  Vitals:   01/13/23 0946 01/13/23 0956  BP: (!) 101/53 112/71  Pulse: 63 64  Resp: 19 15  Temp: (!) 35.8 C   SpO2: 99% 98%    Last Pain:  Vitals:   01/13/23 0956  TempSrc:   PainSc: 0-No pain                 Darrin Nipper

## 2023-01-13 NOTE — Op Note (Signed)
[facility] Gastroenterology Patient Name: Alexa Thompson Procedure Date: 01/13/2023 9:16 AM MRN: [id number] Account #: [id number] Date of Birth: 1961-09-09 Admit Type: Outpatient Age: 62 Room: [facility] ENDO ROOM 1 Gender: Female Note Status: Finalized Instrument Name: Park Meo [id number] Procedure:             Colonoscopy Indications:           Surveillance: Personal history of adenomatous polyps                         on last colonoscopy > 3 years ago Providers:             Jonathon Bellows MD, MD Referring MD:          Jaci Standard. Rollene Rotunda (Referring MD) Medicines:             Monitored Anesthesia Care Complications:         No immediate complications. Procedure:             Pre-Anesthesia Assessment:                        - Prior to the procedure, a History and Physical was                         performed, and patient medications, allergies and                         sensitivities were reviewed. The patient's tolerance                         of previous anesthesia was reviewed.                        - The risks and benefits of the procedure and the                         sedation options and risks were discussed with the                         patient. All questions were answered and informed                         consent was obtained.                        - ASA Grade Assessment: II - A patient with mild                         systemic disease.                        After obtaining informed consent, the colonoscope was                         passed under direct vision. Throughout the procedure,                         the patient's blood pressure, pulse, and oxygen  saturations were monitored continuously. The                         Colonoscope was introduced through the anus and                         advanced to the the cecum, identified by the                         appendiceal orifice. The colonoscopy was performed                          with ease. The patient tolerated the procedure well.                         The quality of the bowel preparation was excellent.                         The ileocecal valve, appendiceal orifice, and rectum                         were photographed. Findings:      The perianal and digital rectal examinations were normal.      The entire examined colon appeared normal on direct and retroflexion       views. Impression:            - The entire examined colon is normal on direct and                         retroflexion views.                        - No specimens collected. Recommendation:        - Discharge patient to home (with escort).                        - Resume previous diet.                        - Continue present medications.                        - Repeat colonoscopy in 5 years for surveillance. Procedure Code(s):     --- Professional ---                        [phone], Colonoscopy, flexible; diagnostic, including                         collection of specimen(s) by brushing or washing, when                         performed (separate procedure) Diagnosis Code(s):     --- Professional ---                        Z86.010, Personal history of colonic polyps CPT copyright 2022 American Medical Association. All rights reserved. The codes documented in this report are preliminary and upon coder review may  be revised to meet current compliance requirements. Jonathon Bellows, MD Jonathon Bellows  MD, MD 01/13/2023 9:39:13 AM This report has been signed electronically. Number of Addenda: 0 Note Initiated On: 01/13/2023 9:16 AM Scope Withdrawal Time: 0 hours 7 minutes 40 seconds  Total Procedure Duration: 0 hours 11 minutes 22 seconds  Estimated Blood Loss:  Estimated blood loss: none.      [facility]

## 2023-01-13 NOTE — H&P (Signed)
Alexa Bellows, MD [address], [place], [place], [place], [address] [address], [place], [place], [place], [address] Phone: [phone]  Fax: [phone]  Primary Care Physician:  Gwyneth Sprout, FNP   Pre-Procedure History & Physical: HPI:  Alexa Thompson is a 62 y.o. female is here for an colonoscopy.   Past Medical History:  Diagnosis Date   Allergy    Heart murmur    Hypertension    Substance abuse [facility])     Past Surgical History:  Procedure Laterality Date   APPENDECTOMY  age 56ish   CESAREAN SECTION  age 32   COLONOSCOPY WITH PROPOFOL N/A 05/11/2019   Procedure: COLONOSCOPY WITH PROPOFOL;  Surgeon: Alexa Bellows, MD;  Location: [facility] ENDOSCOPY;  Service: Gastroenterology;  Laterality: N/A;   ESOPHAGOGASTRODUODENOSCOPY (EGD) WITH PROPOFOL N/A 05/11/2019   Procedure: ESOPHAGOGASTRODUODENOSCOPY (EGD) WITH PROPOFOL;  Surgeon: Alexa Bellows, MD;  Location: [facility] ENDOSCOPY;  Service: Gastroenterology;  Laterality: N/A;   HERNIA REPAIR  age 20ish    Prior to Admission medications   Medication Sig Start Date End Date Taking? Authorizing Provider  clotrimazole-betamethasone (LOTRISONE) cream Apply 1 application topically 2 (two) times daily. 07/17/20  Yes Trinna Post, PA-C  losartan-hydrochlorothiazide (HYZAAR) 50-12.5 MG tablet Take 1 tablet by mouth daily. 04/03/22  Yes Gwyneth Sprout, FNP  rosuvastatin (CRESTOR) 10 MG tablet Take 1 tablet (10 mg total) by mouth daily. 04/03/22  Yes Gwyneth Sprout, FNP  albuterol (VENTOLIN HFA) 108 (90 Base) MCG/ACT inhaler Inhale 2 puffs into the lungs every 6 (six) hours as needed for wheezing or shortness of breath. 12/19/22   Gwyneth Sprout, FNP    Allergies as of 12/20/2022 - Review Complete 07/02/2022  Allergen Reaction Noted   Judithann Sauger aerosphere [budeson-glycopyrrol-formoterol] Other (See Comments) 07/02/2022    Family History  Problem Relation Age of Onset   Kidney disease Mother    COPD Mother    Hypertension Mother    Diabetes  Father    Heart disease Father        CHF   Breast cancer Neg Hx     Social History   Socioeconomic History   Marital status: Single    Spouse name: Not on file   Number of children: Not on file   Years of education: Not on file   Highest education level: Not on file  Occupational History   Not on file  Tobacco Use   Smoking status: Every Day    Packs/day: 1.50    Years: 45.00    Total pack years: 67.50    Types: Cigarettes   Smokeless tobacco: Never  Vaping Use   Vaping Use: Never used  Substance and Sexual Activity   Alcohol use: Yes    Alcohol/week: 4.0 standard drinks of alcohol    Types: 4 Cans of beer per week    Comment: previous heavy drinker October, 2016   Drug use: Not Currently    Types: "Crack" cocaine, Marijuana, Benzodiazepines, Other-see comments    Comment: recovering. last use of Cocaine was February 01, 2016   Sexual activity: Not Currently  Other Topics Concern   Not on file  Social History Narrative   Not on file   Social Determinants of Health   Financial Resource Strain: Not on file  Food Insecurity: Not on file  Transportation Needs: Not on file  Physical Activity: Sufficiently Active (05/03/2019)   Exercise Vital Sign    Days of Exercise per Week: 4 days    Minutes  of Exercise per Session: 60 min  Stress: No Stress Concern Present (05/03/2019)   [place]    Feeling of Stress : Not at all  Social Connections: Moderately Isolated (05/03/2019)   Social Connection and Isolation Panel [NHANES]    Frequency of Communication with Friends and Family: More than three times a week    Frequency of Social Gatherings with Friends and Family: Three times a week    Attends Religious Services: 1 to 4 times per year    Active Member of Clubs or Organizations: No    Attends Archivist Meetings: Never    Marital Status: Divorced  Human resources officer Violence: Not At Risk (05/03/2019)    Humiliation, Afraid, Rape, and Kick questionnaire    Fear of Current or Ex-Partner: No    Emotionally Abused: No    Physically Abused: No    Sexually Abused: No    Review of Systems: See HPI, otherwise negative ROS  Physical Exam: BP 120/68   Temp (!) 96.6 F (35.9 C) (Temporal)   Ht '5\' 4"'$  (1.626 m)   Wt 58.6 kg   LMP  (LMP Unknown)   SpO2 100%   BMI 22.19 kg/m  General:   Alert,  pleasant and cooperative in NAD Head:  Normocephalic and atraumatic. Neck:  Supple; no masses or thyromegaly. Lungs:  Clear throughout to auscultation, normal respiratory effort.    Heart:  +S1, +S2, Regular rate and rhythm, No edema. Abdomen:  Soft, nontender and nondistended. Normal bowel sounds, without guarding, and without rebound.   Neurologic:  Alert and  oriented x4;  grossly normal neurologically.  Impression/Plan: Alexa Thompson is here for an colonoscopy to be performed for surveillance due to prior history of colon polyps   Risks, benefits, limitations, and alternatives regarding  colonoscopy have been reviewed with the patient.  Questions have been answered.  All parties agreeable.   Alexa Bellows, MD  01/13/2023, 9:15 AM

## 2023-01-13 NOTE — Anesthesia Preprocedure Evaluation (Addendum)
Anesthesia Evaluation  Patient identified by MRN, date of birth, ID band Patient awake    Reviewed: Allergy & Precautions, NPO status , Patient's Chart, lab work & pertinent test results  History of Anesthesia Complications Negative for: history of anesthetic complications  Airway Mallampati: II   Neck ROM: Full    Dental  (+) Upper Dentures, Lower Dentures   Pulmonary COPD, Current Smoker (1/2 ppd)Patient did not abstain from smoking.   Pulmonary exam normal breath sounds clear to auscultation       Cardiovascular hypertension, + Peripheral Vascular Disease (thoracic aortic aneurysm)  Normal cardiovascular exam Rhythm:Regular Rate:Normal     Neuro/Psych Polysubstance use disorder including alcohol (3-4 beers per day), cocaine (none in 5 years), benzodiazepines, marijuana    GI/Hepatic negative GI ROS,,,  Endo/Other  negative endocrine ROS    Renal/GU negative Renal ROS     Musculoskeletal   Abdominal   Peds  Hematology negative hematology ROS (+)   Anesthesia Other Findings   Reproductive/Obstetrics                             Anesthesia Physical Anesthesia Plan  ASA: 3  Anesthesia Plan: General   Post-op Pain Management:    Induction: Intravenous  PONV Risk Score and Plan: 2 and Propofol infusion, TIVA and Treatment may vary due to age or medical condition  Airway Management Planned: Natural Airway  Additional Equipment:   Intra-op Plan:   Post-operative Plan:   Informed Consent: I have reviewed the patients History and Physical, chart, labs and discussed the procedure including the risks, benefits and alternatives for the proposed anesthesia with the patient or authorized representative who has indicated his/her understanding and acceptance.       Plan Discussed with: CRNA  Anesthesia Plan Comments: (LMA/GETA backup discussed.  Patient consented for risks of  anesthesia including but not limited to:  - adverse reactions to medications - damage to eyes, teeth, lips or other oral mucosa - nerve damage due to positioning  - sore throat or hoarseness - damage to heart, brain, nerves, lungs, other parts of body or loss of life  Informed patient about role of CRNA in peri- and intra-operative care.  Patient voiced understanding.)        Anesthesia Quick Evaluation

## 2023-01-13 NOTE — Transfer of Care (Signed)
Immediate Anesthesia Transfer of Care Note  Patient: Alexa Thompson  Procedure(s) Performed: COLONOSCOPY WITH PROPOFOL  Patient Location: PACU and Endoscopy Unit  Anesthesia Type:General  Level of Consciousness: awake, drowsy, and patient cooperative  Airway & Oxygen Therapy: Patient Spontanous Breathing  Post-op Assessment: Report given to RN and Post -op Vital signs reviewed and stable  Post vital signs: Reviewed and stable  Last Vitals:  Vitals Value Taken Time  BP 101/53 01/13/23 0946  Temp 35.8 C 01/13/23 0946  Pulse 63 01/13/23 0946  Resp 19 01/13/23 0946  SpO2 99 % 01/13/23 0946    Last Pain:  Vitals:   01/13/23 0946  TempSrc: Temporal  PainSc: 0-No pain         Complications: No notable events documented.

## 2023-01-14 ENCOUNTER — Encounter: Payer: Self-pay | Admitting: Gastroenterology

## 2023-01-21 ENCOUNTER — Ambulatory Visit: Payer: BC Managed Care – PPO | Attending: Family Medicine

## 2023-03-08 ENCOUNTER — Emergency Department
Admission: EM | Admit: 2023-03-08 | Discharge: 2023-03-08 | Disposition: A | Discharge status: Home / Self Care | Payer: BC Managed Care – PPO | Attending: Emergency Medicine | Admitting: Emergency Medicine

## 2023-03-08 ENCOUNTER — Other Ambulatory Visit: Payer: Self-pay

## 2023-03-08 ENCOUNTER — Emergency Department: Payer: BC Managed Care – PPO

## 2023-03-08 DIAGNOSIS — J40 Bronchitis, not specified as acute or chronic: Secondary | ICD-10-CM | POA: Diagnosis not present

## 2023-03-08 DIAGNOSIS — J449 Chronic obstructive pulmonary disease, unspecified: Secondary | ICD-10-CM | POA: Insufficient documentation

## 2023-03-08 DIAGNOSIS — Z1152 Encounter for screening for COVID-19: Secondary | ICD-10-CM | POA: Diagnosis not present

## 2023-03-08 DIAGNOSIS — R0602 Shortness of breath: Secondary | ICD-10-CM | POA: Diagnosis not present

## 2023-03-08 DIAGNOSIS — E119 Type 2 diabetes mellitus without complications: Secondary | ICD-10-CM | POA: Insufficient documentation

## 2023-03-08 HISTORY — DX: Chronic obstructive pulmonary disease, unspecified: J44.9

## 2023-03-08 LAB — CBC
HCT: 39.7 % (ref 36.0–46.0)
Hemoglobin: 13.5 g/dL (ref 12.0–15.0)
MCH: 33.8 pg (ref 26.0–34.0)
MCHC: 34 g/dL (ref 30.0–36.0)
MCV: 99.5 fL (ref 80.0–100.0)
Platelets: 171 10*3/uL (ref 150–400)
RBC: 3.99 MIL/uL (ref 3.87–5.11)
RDW: 12.5 % (ref 11.5–15.5)
WBC: 6.6 10*3/uL (ref 4.0–10.5)
nRBC: 0 % (ref 0.0–0.2)

## 2023-03-08 LAB — RESP PANEL BY RT-PCR (RSV, FLU A&B, COVID)  RVPGX2
Influenza A by PCR: NEGATIVE
Influenza B by PCR: NEGATIVE
Resp Syncytial Virus by PCR: NEGATIVE
SARS Coronavirus 2 by RT PCR: NEGATIVE

## 2023-03-08 LAB — BASIC METABOLIC PANEL
Anion gap: 10 (ref 5–15)
BUN: 17 mg/dL (ref 8–23)
CO2: 23 mmol/L (ref 22–32)
Calcium: 9.6 mg/dL (ref 8.9–10.3)
Chloride: 101 mmol/L (ref 98–111)
Creatinine, Ser: 0.76 mg/dL (ref 0.44–1.00)
GFR, Estimated: >60 mL/min (ref >60)
Glucose, Bld: 115 mg/dL — ABNORMAL HIGH (ref 70–99)
Potassium: 3.8 mmol/L (ref 3.5–5.1)
Sodium: 134 mmol/L — ABNORMAL LOW (ref 135–145)

## 2023-03-08 LAB — TROPONIN I (HIGH SENSITIVITY): Troponin I (High Sensitivity): 4 ng/L (ref <18)

## 2023-03-08 MED ORDER — ALBUTEROL SULFATE HFA 108 (90 BASE) MCG/ACT IN AERS: 2.0000 | INHALATION_SPRAY | Freq: Four times a day (QID) | RESPIRATORY_TRACT | 2 refills | Status: DC | PRN

## 2023-03-08 MED ORDER — PREDNISONE 10 MG (21) PO TBPK: ORAL_TABLET | ORAL | 0 refills | Status: DC

## 2023-03-08 MED ORDER — BENZONATATE 100 MG PO CAPS: ORAL_CAPSULE | ORAL | 0 refills | Status: DC

## 2023-03-08 MED ORDER — IPRATROPIUM-ALBUTEROL 0.5-2.5 (3) MG/3ML IN SOLN
3.0000 mL | Freq: Once | RESPIRATORY_TRACT | Status: AC
  Administered 2023-03-08: 3 mL via RESPIRATORY_TRACT
  Filled 2023-03-08: qty 3

## 2023-03-08 NOTE — Discharge Instructions (Addendum)
Your exam, x-ray, EKG and lab work is normal and reassuring at this time but no signs of any serious underlying infection.  Take prescription meds as directed.  Continue with a daily allergy medicine.  Follow-up with your primary provider or return to the ED if needed.

## 2023-03-08 NOTE — ED Triage Notes (Signed)
Pt to ED via Pov from home. Pt reports SOB, chest tightness and cough since yesterday that has gotten worse. Pt reports hx of COPD. Pt current everyday smoker.

## 2023-03-08 NOTE — ED Provider Notes (Addendum)
[facility] Emergency Department Provider Note     Event Date/Time   First MD Initiated Contact with Patient 03/08/23 1302     (approximate)   History   Shortness of Breath   HPI  Alexa Thompson is a 62 y.o. female with a history of HD DM, COPD, and HDL, presents to the ED from home.  Patient reports some shortness of breath, chest tightness and cough since yesterday.  She denies any fevers, chills, or sweats.  Patient denies any hemoptysis, frank chest pain, or diaphoresis.   Physical Exam   Triage Vital Signs: ED Triage Vitals  Enc Vitals Group     BP 03/08/23 1209 (!) 158/104     Pulse Rate 03/08/23 1209 60     Resp 03/08/23 1209 18     Temp 03/08/23 1209 98 F (36.7 C)     Temp Source 03/08/23 1209 Oral     SpO2 03/08/23 1209 95 %     Weight --      Height --      Head Circumference --      Peak Flow --      Pain Score 03/08/23 1206 2     Pain Loc --      Pain Edu? --      Excl. in GC? --     Most recent vital signs: Vitals:   03/08/23 1209  BP: (!) 158/104  Pulse: 60  Resp: 18  Temp: 98 F (36.7 C)  SpO2: 95%    General Awake, no distress.  NAD HEENT NCAT. PERRL. EOMI. No rhinorrhea. Mucous membranes are moist.  CV:  Good peripheral perfusion. RRR RESP:  Normal effort. Mild rhonchi ABD:  No distention.   ED Results / Procedures / Treatments   Labs (all labs ordered are listed, but only abnormal results are displayed) Labs Reviewed  BASIC METABOLIC PANEL - Abnormal; Notable for the following components:      Result Value   Sodium 134 (*)    Glucose, Bld 115 (*)    All other components within normal limits  RESP PANEL BY RT-PCR (RSV, FLU A&B, COVID)  [id number]  CBC  TROPONIN I (HIGH SENSITIVITY)    EKG  Vent. rate 92 BPM PR interval 132 ms QRS duration 90 ms QT/QTcB 388/479 ms P-R-T axes 84 80 72 Normal sinus rhythm Normal ECG  RADIOLOGY  I personally viewed and evaluated these images as part of my medical  decision making, as well as reviewing the written report by the radiologist.  ED Provider Interpretation: no acute findings  DG Chest 2 View  Result Date: 03/08/2023 CLINICAL DATA:  Shortness of breath EXAM: CHEST - 2 VIEW COMPARISON:  CT chest dated 04/20/2020. FINDINGS: The heart size and mediastinal contours are within normal limits. Both lungs are clear. The visualized skeletal structures are unremarkable. IMPRESSION: No active cardiopulmonary disease. Electronically Signed   By: Romona Curls M.D.   On: 03/08/2023 12:30     PROCEDURES:  Critical Care performed: No  Procedures   MEDICATIONS ORDERED IN ED: Medications  ipratropium-albuterol (DUONEB) 0.5-2.5 (3) MG/3ML nebulizer solution 3 mL (3 mLs Nebulization Given 03/08/23 1415)     IMPRESSION / MDM / ASSESSMENT AND PLAN / ED COURSE  I reviewed the triage vital signs and the nursing notes.  Differential diagnosis includes, but is not limited to, CVA PE, COPD exacerbation, asthma exacerbation, bronchospasm, viral URI, COVID, flu, RSV  Patient's presentation is most consistent with acute complicated illness / injury requiring diagnostic workup.  Patient with reassuring exam overall. Abnormalities appreciated troponin is normal x 1 with low suspicion for ACS.  No critical anemia or electrolyte abnormalities.  No malignant arrhythmia on the EKG.  Chest x-ray without evidence of acute intrathoracic process.  Patient's diagnosis is consistent with acute bronchitis versus viral URI . Patient will be discharged home with prescriptions for Tessalon Perles, albuterol inhaler, and prednisone. Patient is to follow up with her primary provider as needed or otherwise directed. Patient is given ED precautions to return to the ED for any worsening or new symptoms.  Clinical Course as of 03/08/23 1547  Sat Mar 08, 2023  1449 Troponin I (High Sensitivity) [JM]    Clinical Course User Index [JM] Taite Schoeppner, Charlesetta Ivory, PA-C    FINAL CLINICAL IMPRESSION(S) / ED DIAGNOSES   Final diagnoses:  Bronchitis     Rx / DC Orders   ED Discharge Orders          Ordered    benzonatate (TESSALON PERLES) 100 MG capsule        03/08/23 1446    albuterol (VENTOLIN HFA) 108 (90 Base) MCG/ACT inhaler  Every 6 hours PRN        03/08/23 1446    predniSONE (STERAPRED UNI-PAK 21 TAB) 10 MG (21) TBPK tablet  Status:  Discontinued        03/08/23 1446    predniSONE (STERAPRED UNI-PAK 21 TAB) 10 MG (21) TBPK tablet        03/08/23 1447             Note:  This document was prepared using Dragon voice recognition software and may include unintentional dictation errors.    Lissa Hoard, PA-C 03/08/23 1550    Lissa Hoard, PA-C 03/08/23 1551    Chesley Noon, MD 03/08/23 [phone]

## 2023-03-11 ENCOUNTER — Ambulatory Visit (INDEPENDENT_AMBULATORY_CARE_PROVIDER_SITE_OTHER): Payer: BC Managed Care – PPO | Admitting: Family Medicine

## 2023-03-11 ENCOUNTER — Encounter: Payer: Self-pay | Admitting: Family Medicine

## 2023-03-11 VITALS — BP 143/89 | HR 69 | Temp 98.5°F | Wt 131.0 lb

## 2023-03-11 DIAGNOSIS — Z09 Encounter for follow-up examination after completed treatment for conditions other than malignant neoplasm: Secondary | ICD-10-CM | POA: Diagnosis not present

## 2023-03-11 DIAGNOSIS — J40 Bronchitis, not specified as acute or chronic: Secondary | ICD-10-CM | POA: Diagnosis not present

## 2023-03-11 DIAGNOSIS — F17210 Nicotine dependence, cigarettes, uncomplicated: Secondary | ICD-10-CM | POA: Diagnosis not present

## 2023-03-11 DIAGNOSIS — J441 Chronic obstructive pulmonary disease with (acute) exacerbation: Secondary | ICD-10-CM | POA: Diagnosis not present

## 2023-03-11 DIAGNOSIS — I10 Essential (primary) hypertension: Secondary | ICD-10-CM

## 2023-03-11 MED ORDER — TRELEGY ELLIPTA 100-62.5-25 MCG/ACT IN AEPB: 1.0000 | INHALATION_SPRAY | Freq: Every day | RESPIRATORY_TRACT | 11 refills | Status: DC

## 2023-03-11 MED ORDER — LOSARTAN POTASSIUM-HCTZ 100-25 MG PO TABS: 1.0000 | ORAL_TABLET | Freq: Every day | ORAL | 3 refills | Status: DC

## 2023-03-11 NOTE — Assessment & Plan Note (Signed)
Chronic, stable Continues to decline assistance in cessation efforts

## 2023-03-11 NOTE — Assessment & Plan Note (Signed)
Chronic, remains elevated Recommend titration from 50-12.5 to 100-25 hyzaar with 1 month f/u Goal <140/<90

## 2023-03-11 NOTE — Assessment & Plan Note (Signed)
Acute, improving Has picked up OTC anti-histamine to assist Reports all symptoms have improved; however, wishes to start daily controller inhaler and is worried given previous complications with other medications LCTAB today

## 2023-03-11 NOTE — Progress Notes (Signed)
Established patient visit   Patient: Alexa Thompson   DOB: [birth year]   62 y.o. Female  MRN: [id number] Visit Date: 03/11/2023  Today's healthcare provider: Jacky Kindle, FNP   Re Introduced to nurse practitioner role and practice setting.  All questions answered.  Discussed provider/patient relationship and expectations.  Subjective    HPI  Follow up Hospitalization  Patient was admitted to [facility] on 03/08/2023 and discharged on same day. She was treated for Bronchitis. Treatment for this included Ioratropium-Albuterol 3 mL. Testing included EKG, DG Chest 2 view, Troponin I, and Resp panel by RT-PCR,  Patient was sent home with Tessalon, Albuterol, Prednisone and OTC Delsym. Patient reports she is feeling some better but is still having a lot of coughing but less wheezing.  Cough is minimally productive.  She denies fever, chest pain and resting shortness of breath.  She does get SOB with exertion and when coughing.  Cough is keeping her from sleeping well.  ----------------------------------------------------------------------------------------- -   Medications: Outpatient Medications Prior to Visit  Medication Sig   albuterol (VENTOLIN HFA) 108 (90 Base) MCG/ACT inhaler Inhale 2 puffs into the lungs every 6 (six) hours as needed for wheezing or shortness of breath.   albuterol (VENTOLIN HFA) 108 (90 Base) MCG/ACT inhaler Inhale 2 puffs into the lungs every 6 (six) hours as needed for wheezing or shortness of breath.   benzonatate (TESSALON PERLES) 100 MG capsule Take 1-2 tabs TID prn cough   clotrimazole-betamethasone (LOTRISONE) cream Apply 1 application topically 2 (two) times daily.   predniSONE (STERAPRED UNI-PAK 21 TAB) 10 MG (21) TBPK tablet 6-day taper as directed.   rosuvastatin (CRESTOR) 10 MG tablet Take 1 tablet (10 mg total) by mouth daily.   [DISCONTINUED] losartan-hydrochlorothiazide (HYZAAR) 50-12.5 MG tablet Take 1 tablet by mouth daily.   No  facility-administered medications prior to visit.    Review of Systems    Objective    BP (!) 143/89 (BP Location: Left Arm, Patient Position: Sitting, Cuff Size: Normal)   Pulse 69   Temp 98.5 F (36.9 C) (Oral)   Wt 131 lb (59.4 kg)   LMP  (LMP Unknown)   SpO2 97%   BMI 22.49 kg/m   Physical Exam Vitals and nursing note reviewed.  Constitutional:      General: She is not in acute distress.    Appearance: Normal appearance. She is normal weight. She is not ill-appearing, toxic-appearing or diaphoretic.  HENT:     Head: Normocephalic and atraumatic.  Cardiovascular:     Rate and Rhythm: Normal rate and regular rhythm.     Pulses: Normal pulses.     Heart sounds: Normal heart sounds. No murmur heard.    No friction rub. No gallop.  Pulmonary:     Effort: Pulmonary effort is normal. No respiratory distress.     Breath sounds: Normal breath sounds. No stridor. No wheezing, rhonchi or rales.  Chest:     Chest wall: No tenderness.  Musculoskeletal:        General: No swelling, tenderness, deformity or signs of injury. Normal range of motion.     Right lower leg: No edema.     Left lower leg: No edema.  Skin:    General: Skin is warm and dry.     Capillary Refill: Capillary refill takes less than 2 seconds.     Coloration: Skin is not jaundiced or pale.     Findings: No bruising, erythema, lesion or rash.  Neurological:     General: No focal deficit present.     Mental Status: She is alert and oriented to person, place, and time. Mental status is at baseline.     Cranial Nerves: No cranial nerve deficit.     Sensory: No sensory deficit.     Motor: No weakness.     Coordination: Coordination normal.  Psychiatric:        Mood and Affect: Mood normal.        Behavior: Behavior normal.        Thought Content: Thought content normal.        Judgment: Judgment normal.     No results found for any visits on 03/11/23.  Assessment & Plan     Problem List Items Addressed  This Visit       Cardiovascular and Mediastinum   Essential hypertension, benign    Chronic, remains elevated Recommend titration from 50-12.5 to 100-25 hyzaar with 1 month f/u Goal <140/<90      Relevant Medications   losartan-hydrochlorothiazide (HYZAAR) 100-25 MG tablet     Respiratory   Bronchitis    Acute, improving Has picked up OTC anti-histamine to assist Reports all symptoms have improved; however, wishes to start daily controller inhaler and is worried given previous complications with other medications LCTAB today        Other   Cigarette nicotine dependence without complication    Chronic, stable Continues to decline assistance in cessation efforts       Hospital discharge follow-up - Primary    Admitted for bronchitis without concern for COPD exacerbation; negative RPP Continues to use tobacco products and is compliant with previous recommendations for bronchitis treatment       Return in about 4 weeks (around 04/08/2023) for HTN management, chonic disease management.     Leilani Merl, FNP, have reviewed all documentation for this visit. The documentation on 03/11/23 for the exam, diagnosis, procedures, and orders are all accurate and complete.  Jacky Kindle, FNP  [facility] [phone] (phone) [phone] (fax)  [facility]

## 2023-03-11 NOTE — Assessment & Plan Note (Signed)
Admitted for bronchitis without concern for COPD exacerbation; negative RPP Continues to use tobacco products and is compliant with previous recommendations for bronchitis treatment

## 2023-03-12 ENCOUNTER — Telehealth: Payer: Self-pay

## 2023-03-12 ENCOUNTER — Other Ambulatory Visit: Payer: Self-pay | Admitting: Family Medicine

## 2023-03-12 DIAGNOSIS — J441 Chronic obstructive pulmonary disease with (acute) exacerbation: Secondary | ICD-10-CM | POA: Insufficient documentation

## 2023-03-12 MED ORDER — DOXYCYCLINE HYCLATE 100 MG PO TABS
100.0000 mg | ORAL_TABLET | Freq: Two times a day (BID) | ORAL | 0 refills | Status: DC
Start: 2023-03-12 — End: 2023-04-18

## 2023-03-12 MED ORDER — AZITHROMYCIN 500 MG PO TABS
500.0000 mg | ORAL_TABLET | Freq: Every day | ORAL | 0 refills | Status: DC
Start: 2023-03-12 — End: 2023-04-18

## 2023-03-12 NOTE — Assessment & Plan Note (Signed)
Recommend ABX at this time given poor improvement with steroids alone despite improvement in wheezing

## 2023-03-12 NOTE — Telephone Encounter (Signed)
Copied from CRM [id number]. Topic: General - Other >> Mar 12, 2023  8:37 AM Dondra Prader A wrote:  Reason for CRM: Pt states that she seen PCP yesterday and she offered her antibiotics, pt states that she turned down the antibiotics but is needing them now. Pt is wanting to see if PCP will call the antibiotics in for her.   [id number] Alexa Thompson, [place] - [address] AT [facility] OF [address] [facility]  Phone: [phone] Fax: [phone]

## 2023-03-12 NOTE — Addendum Note (Signed)
Addended by: Merita Norton T on: 03/12/2023 09:52 AM   Modules accepted: Orders

## 2023-04-09 NOTE — Progress Notes (Unsigned)
    No call/no show for appt.  Jacky Kindle, FNP  [facility] [phone] (phone) [phone] (fax)  [facility]

## 2023-04-10 ENCOUNTER — Ambulatory Visit (INDEPENDENT_AMBULATORY_CARE_PROVIDER_SITE_OTHER): Payer: BC Managed Care – PPO | Admitting: Family Medicine

## 2023-04-10 DIAGNOSIS — Z91199 Patient's noncompliance with other medical treatment and regimen due to unspecified reason: Secondary | ICD-10-CM

## 2023-04-15 ENCOUNTER — Other Ambulatory Visit: Payer: Self-pay | Admitting: Family Medicine

## 2023-04-15 ENCOUNTER — Ambulatory Visit: Payer: Self-pay | Admitting: *Deleted

## 2023-04-15 DIAGNOSIS — E7800 Pure hypercholesterolemia, unspecified: Secondary | ICD-10-CM

## 2023-04-15 NOTE — Telephone Encounter (Signed)
Medication Refill - Medication: rosuvastatin (CRESTOR) 10 MG tablet ,   Has the patient contacted their pharmacy? No. (Agent: If no, request that the patient contact the pharmacy for the refill. If patient does not wish to contact the pharmacy document the reason why and proceed with request.)  Preferred Pharmacy (with phone number or street name):  [facility] #[id number] Cheree Ditto, [place] - [address] AT [facility] OF [address] [place]  [address] [place] [address]  Phone: [phone] Fax: [phone]  Hours: Not open 24 hours   Has the patient been seen for an appointment in the last year OR does the patient have an upcoming appointment? Yes.    Agent: Please be advised that RX refills may take up to 3 business days. We ask that you follow-up with your pharmacy.

## 2023-04-15 NOTE — Telephone Encounter (Signed)
  Chief Complaint: requesting antibiotics Symptoms: cough productive yellow mucus. Shortness of breath with exertion. Wheezing at times. Weakness.  Frequency: on going since 03/08/23 Pertinent Negatives: Patient denies difficulty breathing. No fever reported.  Disposition: [] ED /[] Urgent Care (no appt availability in office) / [x] Appointment(In office/virtual)/ []  [facility]/ [] Home Care/ [] Refused Recommended Disposition /[] [place] [facility]/ []  Follow-up with PCP Additional Notes:   Appt scheduled tomorrow. Patient reports she is working today and tomorrow but will leave early. Please advise if medication can be prescribed.      Reason for Disposition  [1] MILD difficulty breathing (e.g., minimal/no SOB at rest, SOB with walking, pulse <100) AND [2] NEW-onset or WORSE than normal  Answer Assessment - Initial Assessment Questions 1. RESPIRATORY STATUS: "Describe your breathing?" (e.g., wheezing, shortness of breath, unable to speak, severe coughing)      Shortness of breath coughing , wheezing  2. ONSET: "When did this breathing problem begin?"      Comes and goes esp when gets over heated 3. PATTERN "Does the difficult breathing come and go, or has it been constant since it started?"      na 4. SEVERITY: "How bad is your breathing?" (e.g., mild, moderate, severe)    - MILD: No SOB at rest, mild SOB with walking, speaks normally in sentences, can lie down, no retractions, pulse < 100.    - MODERATE: SOB at rest, SOB with minimal exertion and prefers to sit, cannot lie down flat, speaks in phrases, mild retractions, audible wheezing, pulse 100-120.    - SEVERE: Very SOB at rest, speaks in single words, struggling to breathe, sitting hunched forward, retractions, pulse > 120      *No Answer* 5. RECURRENT SYMPTOM: "Have you had difficulty breathing before?" If Yes, ask: "When was the last time?" and "What happened that time?"      *No Answer* 6. CARDIAC HISTORY: "Do you  have any history of heart disease?" (e.g., heart attack, angina, bypass surgery, angioplasty)      *No Answer* 7. LUNG HISTORY: "Do you have any history of lung disease?"  (e.g., pulmonary embolus, asthma, emphysema)     *No Answer* 8. CAUSE: "What do you think is causing the breathing problem?"      *No Answer* 9. OTHER SYMPTOMS: "Do you have any other symptoms? (e.g., dizziness, runny nose, cough, chest pain, fever)     Cough, shortness of breath  10. O2 SATURATION MONITOR:  "Do you use an oxygen saturation monitor (pulse oximeter) at home?" If Yes, ask: "What is your reading (oxygen level) today?" "What is your usual oxygen saturation reading?" (e.g., 95%)       na 11. PREGNANCY: "Is there any chance you are pregnant?" "When was your last menstrual period?"       na 12. TRAVEL: "Have you traveled out of the country in the last month?" (e.g., travel history, exposures)       na  Protocols used: Breathing Difficulty-A-AH

## 2023-04-16 ENCOUNTER — Ambulatory Visit (INDEPENDENT_AMBULATORY_CARE_PROVIDER_SITE_OTHER): Payer: BC Managed Care – PPO | Admitting: Family Medicine

## 2023-04-16 DIAGNOSIS — Z91199 Patient's noncompliance with other medical treatment and regimen due to unspecified reason: Secondary | ICD-10-CM

## 2023-04-16 MED ORDER — ROSUVASTATIN CALCIUM 10 MG PO TABS
10.0000 mg | ORAL_TABLET | Freq: Every day | ORAL | 0 refills | Status: DC
Start: 2023-04-16 — End: 2023-07-15

## 2023-04-16 NOTE — Progress Notes (Signed)
Patient was not seen for appt d/t no call, no show, or late arrival >10 mins past appt time.   Shaquila Sigman T Marlene Beidler, FNP  [facility] [address] [place], [place] [address] [phone] (phone) [phone] (fax) [facility]  

## 2023-04-16 NOTE — Telephone Encounter (Signed)
Requested Prescriptions  Pending Prescriptions Disp Refills   rosuvastatin (CRESTOR) 10 MG tablet 90 tablet 3    Sig: Take 1 tablet (10 mg total) by mouth daily.     Cardiovascular:  Antilipid - Statins 2 Failed - 04/15/2023 10:11 AM      Failed - Lipid Panel in normal range within the last 12 months    Cholesterol, Total  Date Value Ref Range Status  12/19/2022 226 (H) 100 - 199 mg/dL Final   LDL Chol Calc (NIH)  Date Value Ref Range Status  12/19/2022 85 0 - 99 mg/dL Final   HDL  Date Value Ref Range Status  12/19/2022 107 >39 mg/dL Final   Triglycerides  Date Value Ref Range Status  12/19/2022 211 (H) 0 - 149 mg/dL Final         Passed - Cr in normal range and within 360 days    Creat  Date Value Ref Range Status  02/15/2016 0.82 0.50 - 1.05 mg/dL Final   Creatinine, Ser  Date Value Ref Range Status  03/08/2023 0.76 0.44 - 1.00 mg/dL Final         Passed - Patient is not pregnant      Passed - Valid encounter within last 12 months    Recent Outpatient Visits           6 days ago No-show for appointment   [facility] Jacky Kindle, FNP   1 month ago Hospital discharge follow-up   [facility] Jacky Kindle, FNP   3 months ago Annual physical exam   [facility] Jacky Kindle, FNP   9 months ago Essential hypertension, benign   [facility] Merita Norton T, FNP   1 year ago Essential hypertension, benign   [facility] Jacky Kindle, FNP       Future Appointments             Today Jacky Kindle, FNP [facility]

## 2023-04-18 ENCOUNTER — Ambulatory Visit (INDEPENDENT_AMBULATORY_CARE_PROVIDER_SITE_OTHER): Payer: BC Managed Care – PPO | Admitting: Physician Assistant

## 2023-04-18 ENCOUNTER — Ambulatory Visit
Admission: RE | Admit: 2023-04-18 | Discharge: 2023-04-18 | Disposition: A | Discharge status: Home / Self Care | Payer: BC Managed Care – PPO | Attending: Physician Assistant | Admitting: Physician Assistant

## 2023-04-18 ENCOUNTER — Ambulatory Visit
Admission: RE | Admit: 2023-04-18 | Discharge: 2023-04-18 | Disposition: A | Discharge status: Home / Self Care | Payer: BC Managed Care – PPO | Source: Ambulatory Visit | Attending: Physician Assistant | Admitting: Physician Assistant

## 2023-04-18 ENCOUNTER — Encounter: Payer: Self-pay | Admitting: Physician Assistant

## 2023-04-18 VITALS — BP 129/86 | HR 88 | Temp 98.7°F | Ht 64.02 in | Wt 132.4 lb

## 2023-04-18 DIAGNOSIS — R059 Cough, unspecified: Secondary | ICD-10-CM | POA: Diagnosis not present

## 2023-04-18 DIAGNOSIS — J441 Chronic obstructive pulmonary disease with (acute) exacerbation: Secondary | ICD-10-CM

## 2023-04-18 MED ORDER — AIRSUPRA 90-80 MCG/ACT IN AERO
2.0000 | INHALATION_SPRAY | Freq: Four times a day (QID) | RESPIRATORY_TRACT | 1 refills | Status: DC | PRN
Start: 2023-04-18 — End: 2023-07-03

## 2023-04-18 NOTE — Progress Notes (Unsigned)
Acute Office Visit   Patient: Alexa Thompson   DOB: [birth year]   62 y.o. Female  MRN: [id number] Visit Date: 04/18/2023  Today's healthcare provider: Oswaldo Conroy Lizeth Bencosme, PA-C  Introduced myself to the patient as a Secondary school teacher and provided education on APPs in clinical practice.    Chief Complaint  Patient presents with   Cough    Patient states that she knows that she has bronchitis and missed her last dr. Alfonzo Beers.    Subjective    HPI HPI     Cough    Additional comments: Patient states that she knows that she has bronchitis and missed her last dr. Alfonzo Beers.       Last edited by Sherolyn Buba, CMA on 04/18/2023  2:06 PM.       Breathing concern  Onset: gradual progressing  Duration: reports this has been ongoing for about 2 weeks  She reports she has had productive coughing with green to brown phlegm, fatigue, wheezing and SOB  She reports symptoms seem to be worse with heat  She reports she is using her Trelegy once per day and has been using her Albuterol 8-10 times per day  She reports symptoms are worse when laying down and when she is up and moving around   Interventions: OTC antihistamine as needed        Medications: Outpatient Medications Prior to Visit  Medication Sig   albuterol (VENTOLIN HFA) 108 (90 Base) MCG/ACT inhaler Inhale 2 puffs into the lungs every 6 (six) hours as needed for wheezing or shortness of breath.   albuterol (VENTOLIN HFA) 108 (90 Base) MCG/ACT inhaler Inhale 2 puffs into the lungs every 6 (six) hours as needed for wheezing or shortness of breath.   Fluticasone-Umeclidin-Vilant (TRELEGY ELLIPTA) 100-62.5-25 MCG/ACT AEPB Inhale 1 puff into the lungs daily.   losartan-hydrochlorothiazide (HYZAAR) 100-25 MG tablet Take 1 tablet by mouth daily.   rosuvastatin (CRESTOR) 10 MG tablet Take 1 tablet (10 mg total) by mouth daily.   clotrimazole-betamethasone (LOTRISONE) cream Apply 1 application topically 2 (two) times daily. (Patient not taking:  Reported on 04/18/2023)   [DISCONTINUED] azithromycin (ZITHROMAX) 500 MG tablet Take 1 tablet (500 mg total) by mouth daily.   [DISCONTINUED] benzonatate (TESSALON PERLES) 100 MG capsule Take 1-2 tabs TID prn cough (Patient not taking: Reported on 04/18/2023)   [DISCONTINUED] doxycycline (VIBRA-TABS) 100 MG tablet Take 1 tablet (100 mg total) by mouth 2 (two) times daily. (Patient not taking: Reported on 04/18/2023)   [DISCONTINUED] predniSONE (STERAPRED UNI-PAK 21 TAB) 10 MG (21) TBPK tablet 6-day taper as directed.   No facility-administered medications prior to visit.    Review of Systems  Constitutional:  Positive for fatigue. Negative for chills and fever.  HENT:  Positive for congestion.   Respiratory:  Positive for cough, shortness of breath and wheezing.   Neurological:  Positive for headaches.    {Labs  Heme  Chem  Endocrine  Serology  Results Review (optional):23779}   Objective    BP 129/86   Pulse 88   Temp 98.7 F (37.1 C) (Oral)   Ht 5' 4.02" (1.626 m)   Wt 132 lb 6.4 oz (60.1 kg)   LMP  (LMP Unknown)   SpO2 94%   BMI 22.72 kg/m  {Show previous vital signs (optional):23777}  Physical Exam Vitals reviewed.  Constitutional:      General: She is awake.     Appearance: Normal appearance. She is well-developed  and well-groomed.  HENT:     Head: Normocephalic and atraumatic.  Cardiovascular:     Rate and Rhythm: Normal rate and regular rhythm.     Pulses: Normal pulses.          Radial pulses are 2+ on the right side and 2+ on the left side.     Heart sounds: Normal heart sounds. No murmur heard.    No friction rub. No gallop.  Pulmonary:     Effort: Pulmonary effort is normal.     Breath sounds: Decreased air movement present. Examination of the right-middle field reveals decreased breath sounds. Examination of the left-lower field reveals decreased breath sounds. Decreased breath sounds and wheezing present. No rhonchi or rales.  Musculoskeletal:      Cervical back: Normal range of motion and neck supple.     Right lower leg: No edema.     Left lower leg: No edema.  Neurological:     Mental Status: She is alert.  Psychiatric:        Attention and Perception: Attention and perception normal.        Mood and Affect: Mood and affect normal.        Speech: Speech normal.        Behavior: Behavior normal. Behavior is cooperative.       No results found for any visits on 04/18/23.  Assessment & Plan      No follow-ups on file.

## 2023-04-18 NOTE — Patient Instructions (Signed)
I have sent in a script for a new rescue inhaler called Airsupra - you can use this instead of the Albuterol inhaler up to every 6 hours as needed for shortness of breath and wheezing  Please go here for your  xray   [address] [place] [address] [place], [place] [address]   We will keep you updated on the results of your chest xray once they become available.  If your symptoms get worse please seek prompt medical attention .

## 2023-04-21 NOTE — Progress Notes (Signed)
Your chest x-ray did not show signs of active pneumonia or other cardiopulmonary disease.  I recommend that you continue with the measures that we discussed during your appointment.  I also recommend that you schedule a follow-up appointment with your PCP's office for sometime this week to reassess your lungs and discuss how you are doing.  Please let us know if you have further questions or concerns

## 2023-04-21 NOTE — Assessment & Plan Note (Signed)
Acute, recurrent concern Patient reports persistent productive cough over the last 2 weeks accompanied by fatigue, wheezing, and shortness of breath She states that she has been using her Trelegy as directed but has been using her albuterol rescue inhaler 8-10 times per day Will get chest x-ray today to rule out pneumonia or other pulmonary concerns.  Chest x-ray was negative for cardiopulmonary disease process. Will also provide airsupra to replace albuterol rescue inhaler with goal of providing better control of symptoms.  We reviewed appropriate use, i.e. as needed up to every 6 hours. Patient would likely benefit from a referral to pulmonology for further evaluation and potential management.  Will discuss at follow-up We reviewed ED and return precautions and patient voiced understanding and agreement. Recommend follow-up in about a week with her PCP for lung check and to discuss response to new inhaler.

## 2023-04-25 ENCOUNTER — Ambulatory Visit (INDEPENDENT_AMBULATORY_CARE_PROVIDER_SITE_OTHER): Payer: BC Managed Care – PPO | Admitting: Family Medicine

## 2023-04-25 ENCOUNTER — Encounter: Payer: Self-pay | Admitting: Family Medicine

## 2023-04-25 VITALS — BP 132/87 | HR 76 | Ht 64.0 in | Wt 133.0 lb

## 2023-04-25 DIAGNOSIS — F17210 Nicotine dependence, cigarettes, uncomplicated: Secondary | ICD-10-CM

## 2023-04-25 DIAGNOSIS — F1111 Opioid abuse, in remission: Secondary | ICD-10-CM

## 2023-04-25 DIAGNOSIS — J449 Chronic obstructive pulmonary disease, unspecified: Secondary | ICD-10-CM | POA: Diagnosis not present

## 2023-04-25 MED ORDER — PROMETHAZINE-DM 6.25-15 MG/5ML PO SYRP: 5.0000 mL | ORAL_SOLUTION | Freq: Every day | ORAL | 0 refills | Status: DC

## 2023-04-25 MED ORDER — MONTELUKAST SODIUM 10 MG PO TABS: 10.0000 mg | ORAL_TABLET | Freq: Every day | ORAL | 3 refills | Status: DC

## 2023-04-27 ENCOUNTER — Encounter: Payer: Self-pay | Admitting: Family Medicine

## 2023-04-27 NOTE — Assessment & Plan Note (Signed)
Chronic, improved down from 25-15 cigs/day Congratulated! Continues to defer start of NRT at this time

## 2023-04-27 NOTE — Assessment & Plan Note (Signed)
Chronic, no recent concern Pt also notes breathalyzer on her car to assist with previous AUD Continue to monitor

## 2023-04-27 NOTE — Assessment & Plan Note (Signed)
Chronic, s/p exacerbation seen on 5/31 by PA Mecum to assist with SOB/DOE Received new inhaler Pt continues to smoke; however, notes that she will plan to reduce intake further as that COPD exacerbation was scary for her Will provide PRN cough syrup as well as trial of Singulair to assist with cough Continue to work on tobacco reduction with goal of cessation Continue treley in addition to albuterol prn Refer to pulm for PFTs once stabilized

## 2023-04-27 NOTE — Progress Notes (Signed)
Established patient visit   Patient: Alexa Thompson   DOB: 01-04-1961   62 y.o. Female  MRN: [id number] Visit Date: 04/25/2023  Today's healthcare provider: Jacky Kindle, FNP  Re Introduced to nurse practitioner role and practice setting.  All questions answered.  Discussed provider/patient relationship and expectations.  Subjective    HPI   Medications: Outpatient Medications Prior to Visit  Medication Sig   albuterol (VENTOLIN HFA) 108 (90 Base) MCG/ACT inhaler Inhale 2 puffs into the lungs every 6 (six) hours as needed for wheezing or shortness of breath.   Albuterol-Budesonide (AIRSUPRA) 90-80 MCG/ACT AERO Inhale 2 puffs into the lungs every 6 (six) hours as needed.   clotrimazole-betamethasone (LOTRISONE) cream Apply 1 application topically 2 (two) times daily.   Fluticasone-Umeclidin-Vilant (TRELEGY ELLIPTA) 100-62.5-25 MCG/ACT AEPB Inhale 1 puff into the lungs daily.   losartan-hydrochlorothiazide (HYZAAR) 100-25 MG tablet Take 1 tablet by mouth daily.   rosuvastatin (CRESTOR) 10 MG tablet Take 1 tablet (10 mg total) by mouth daily.   [DISCONTINUED] albuterol (VENTOLIN HFA) 108 (90 Base) MCG/ACT inhaler Inhale 2 puffs into the lungs every 6 (six) hours as needed for wheezing or shortness of breath.   No facility-administered medications prior to visit.    Review of Systems  Last CBC Lab Results  Component Value Date   WBC 6.6 03/08/2023   HGB 13.5 03/08/2023   HCT 39.7 03/08/2023   MCV 99.5 03/08/2023   MCH 33.8 03/08/2023   RDW 12.5 03/08/2023   PLT 171 03/08/2023   Last metabolic panel Lab Results  Component Value Date   GLUCOSE 115 (H) 03/08/2023   NA 134 (L) 03/08/2023   K 3.8 03/08/2023   CL 101 03/08/2023   CO2 23 03/08/2023   BUN 17 03/08/2023   CREATININE 0.76 03/08/2023   GFRNONAA >60 03/08/2023   CALCIUM 9.6 03/08/2023   PROT 7.6 12/19/2022   ALBUMIN 4.9 12/19/2022   LABGLOB 2.7 12/19/2022   AGRATIO 1.8 12/19/2022   BILITOT 0.2  12/19/2022   ALKPHOS 70 12/19/2022   AST 22 12/19/2022   ALT 15 12/19/2022   ANIONGAP 10 03/08/2023   Last lipids Lab Results  Component Value Date   CHOL 226 (H) 12/19/2022   HDL 107 12/19/2022   LDLCALC 85 12/19/2022   TRIG 211 (H) 12/19/2022   CHOLHDL 2.1 12/19/2022   Last hemoglobin A1c Lab Results  Component Value Date   HGBA1C 5.7 (H) 12/19/2022   Last thyroid functions Lab Results  Component Value Date   TSH 0.806 12/19/2022     Objective    BP 132/87 (BP Location: Left Arm, Patient Position: Sitting, Cuff Size: Normal)   Pulse 76   Ht 5\' 4"  (1.626 m)   Wt 133 lb (60.3 kg)   LMP  (LMP Unknown)   SpO2 98%   BMI 22.83 kg/m   BP Readings from Last 3 Encounters:  04/25/23 132/87  04/18/23 129/86  03/11/23 (!) 143/89   Wt Readings from Last 3 Encounters:  04/25/23 133 lb (60.3 kg)  04/18/23 132 lb 6.4 oz (60.1 kg)  03/11/23 131 lb (59.4 kg)   SpO2 Readings from Last 3 Encounters:  04/25/23 98%  04/18/23 94%  03/11/23 97%   Physical Exam Vitals and nursing note reviewed.  Constitutional:      General: She is not in acute distress.    Appearance: Normal appearance. She is normal weight. She is not ill-appearing, toxic-appearing or diaphoretic.  HENT:  Head: Normocephalic and atraumatic.  Cardiovascular:     Rate and Rhythm: Normal rate and regular rhythm.     Pulses: Normal pulses.     Heart sounds: Normal heart sounds. No murmur heard.    No friction rub. No gallop.  Pulmonary:     Effort: Pulmonary effort is normal. No respiratory distress.     Breath sounds: Normal breath sounds. No stridor. No wheezing, rhonchi or rales.  Chest:     Chest wall: No tenderness.  Musculoskeletal:        General: No swelling, tenderness, deformity or signs of injury. Normal range of motion.     Right lower leg: No edema.     Left lower leg: No edema.  Skin:    General: Skin is warm and dry.     Capillary Refill: Capillary refill takes less than 2 seconds.      Coloration: Skin is not jaundiced or pale.     Findings: No bruising, erythema, lesion or rash.  Neurological:     General: No focal deficit present.     Mental Status: She is alert and oriented to person, place, and time. Mental status is at baseline.     Cranial Nerves: No cranial nerve deficit.     Sensory: No sensory deficit.     Motor: No weakness.     Coordination: Coordination normal.  Psychiatric:        Mood and Affect: Mood normal.        Behavior: Behavior normal.        Thought Content: Thought content normal.        Judgment: Judgment normal.    No results found for any visits on 04/25/23.  Assessment & Plan     Problem List Items Addressed This Visit       Respiratory   Chronic obstructive pulmonary disease (HCC) - Primary    Chronic, s/p exacerbation seen on 5/31 by PA Mecum to assist with SOB/DOE Received new inhaler Pt continues to smoke; however, notes that she will plan to reduce intake further as that COPD exacerbation was scary for her Will provide PRN cough syrup as well as trial of Singulair to assist with cough Continue to work on tobacco reduction with goal of cessation Continue treley in addition to albuterol prn Refer to pulm for PFTs once stabilized       Relevant Medications   promethazine-dextromethorphan (PROMETHAZINE-DM) 6.25-15 MG/5ML syrup   montelukast (SINGULAIR) 10 MG tablet   Other Relevant Orders   Ambulatory referral to Pulmonology     Other   Cigarette nicotine dependence without complication    Chronic, improved down from 25-15 cigs/day Congratulated! Continues to defer start of NRT at this time      History of opioid abuse (HCC)    Chronic, no recent concern Pt also notes breathalyzer on her car to assist with previous AUD Continue to monitor      Return if symptoms worsen or fail to improve.     Leilani Merl, FNP, have reviewed all documentation for this visit. The documentation on 04/27/23 for the exam,  diagnosis, procedures, and orders are all accurate and complete.  Jacky Kindle, FNP  [facility] [phone] (phone) [phone] (fax)  [facility]

## 2023-05-23 ENCOUNTER — Other Ambulatory Visit: Payer: Self-pay | Admitting: Family Medicine

## 2023-06-06 DIAGNOSIS — J209 Acute bronchitis, unspecified: Secondary | ICD-10-CM | POA: Diagnosis not present

## 2023-06-17 ENCOUNTER — Encounter (HOSPITAL_BASED_OUTPATIENT_CLINIC_OR_DEPARTMENT_OTHER): Payer: Self-pay

## 2023-06-27 ENCOUNTER — Institutional Professional Consult (permissible substitution): Payer: BC Managed Care – PPO | Admitting: Pulmonary Disease

## 2023-07-03 ENCOUNTER — Encounter: Payer: Self-pay | Admitting: Pulmonary Disease

## 2023-07-03 ENCOUNTER — Ambulatory Visit (INDEPENDENT_AMBULATORY_CARE_PROVIDER_SITE_OTHER): Payer: BC Managed Care – PPO | Admitting: Pulmonary Disease

## 2023-07-03 VITALS — BP 122/62 | HR 88 | Temp 97.7°F | Ht 64.0 in | Wt 133.6 lb

## 2023-07-03 DIAGNOSIS — Z720 Tobacco use: Secondary | ICD-10-CM

## 2023-07-03 DIAGNOSIS — J42 Unspecified chronic bronchitis: Secondary | ICD-10-CM

## 2023-07-03 MED ORDER — NICOTINE 7 MG/24HR TD PT24
7.0000 mg | MEDICATED_PATCH | Freq: Every day | TRANSDERMAL | 3 refills | Status: DC
Start: 2023-07-03 — End: 2024-09-27

## 2023-07-03 MED ORDER — NICOTINE POLACRILEX 2 MG MT LOZG
2.0000 mg | LOZENGE | OROMUCOSAL | 6 refills | Status: DC | PRN
Start: 2023-07-03 — End: 2024-09-27

## 2023-07-03 MED ORDER — ALBUTEROL SULFATE HFA 108 (90 BASE) MCG/ACT IN AERS: 2.0000 | INHALATION_SPRAY | Freq: Four times a day (QID) | RESPIRATORY_TRACT | 2 refills | Status: DC | PRN

## 2023-07-03 NOTE — Progress Notes (Signed)
Synopsis: Referred in by Jacky Kindle, FNP   Subjective:   PATIENT ID: Alexa Thompson GENDER: female DOB: 1961/07/09, MRN: [id number]  Chief Complaint  Patient presents with   pulmonary consult    Occ SOB with exertion and prod cough with brown sputum.     HPI [name] is a 62 year old female patient with a past medical history of alcohol use disorder, tobacco use disorder presumed COPD and hyperlipidemia presenting to the pulmonary clinic for evaluation of COPD.  She has had 3 exacerbation for COPD in the past year the most recent one was 2 months ago where she was prescribed prednisone for 5 days after which she felt well.  Her symptoms are usually chest tightness wheezing cough with sputum production.  She does notice chronic wheezing and chronic cough with sputum production.  She denies any weight loss loss of appetite.  She denies any chest pain.  She was prescribed Wixela in the past year then breast 3 which she could not tolerate and currently was started on Trelegy in the past 2 months which she is tolerating well.  She was also prescribed Airsupra to be used as needed but has not really helped her much.  Family history -she denies any family history of lung diseases  Social history -she smokes half pack per day for 35 years, drinks couple of beers per day.  Denies any illicit drug use.  She works at [employer] at [employer].  She has 1 son who is healthy.  She does not have any pets at home.  She had a parakeet for a year that died recently.   ROS All systems were reviewed and are negative except for the above Objective:   Vitals:   07/03/23 1332  BP: 122/62  Pulse: 88  Temp: 97.7 F (36.5 C)  TempSrc: Temporal  SpO2: 96%  Weight: 133 lb 9.6 oz (60.6 kg)  Height: 5\' 4"  (1.626 m)   96% on RA BMI Readings from Last 3 Encounters:  07/03/23 22.93 kg/m  04/25/23 22.83 kg/m  04/18/23 22.72 kg/m   Wt Readings from Last 3 Encounters:  07/03/23 133 lb 9.6 oz  (60.6 kg)  04/25/23 133 lb (60.3 kg)  04/18/23 132 lb 6.4 oz (60.1 kg)    Physical Exam GEN: NAD, Healthy Appearing HEENT: Supple Neck, Reactive Pupils, EOMI  CVS: Normal S1, Normal S2, RRR, No murmurs or ES appreciated  Lungs: Rales appreciated diffusely with expiratory wheezing.  Abdomen: Soft, non tender, non distended, + BS  Extremities: Warm and well perfused, No edema  Skin: No suspicious lesions appreciated  Psych: Normal Affect  Ancillary Information   CBC    Component Value Date/Time   WBC 6.6 03/08/2023 1209   RBC 3.99 03/08/2023 1209   HGB 13.5 03/08/2023 1209   HGB 12.8 12/19/2022 1438   HCT 39.7 03/08/2023 1209   HCT 37.2 12/19/2022 1438   PLT 171 03/08/2023 1209   PLT 170 12/19/2022 1438   MCV 99.5 03/08/2023 1209   MCV 98 (H) 12/19/2022 1438   MCV 101 (H) 12/08/2013 1212   MCH 33.8 03/08/2023 1209   MCHC 34.0 03/08/2023 1209   RDW 12.5 03/08/2023 1209   RDW 13.8 12/19/2022 1438   RDW 13.4 12/08/2013 1212   LYMPHSABS 1.8 12/19/2022 1438   LYMPHSABS 2.8 12/08/2013 1212   MONOABS 0.4 02/15/2016 1018   MONOABS 0.4 12/08/2013 1212   EOSABS 0.1 12/19/2022 1438   EOSABS 0.0 12/08/2013 1212  BASOSABS 0.0 12/19/2022 1438   BASOSABS 0.0 12/08/2013 1212    Imaging  CXR 03/2022: No active cardiopulmonary disease   CT Angio chest in 2021: Shows innumerable tiny scattered centrilobular nodule most  common in the apices.      No data to display           Assessment & Plan:  [name] is a 62 year old female patient with a past medical history of alcohol use disorder, tobacco use disorder presumed COPD and hyperlipidemia presenting to the pulmonary clinic for evaluation of COPD.  #Presumed COPD with recent exacerbation (04/2023) []  PFTs  []  6 minute walk test  []  c/w fluticasone-umeclidinium-vilanterol [Trelegy Ellipta] 100-62.5-25 1 puff daily.  [] c/w Albuterol PRN for chest tightness or wheezing   #Tobacco use disorder  Spent 5 min advising her  abstinence from smoking and the benefits of stopping and how smoking can expedite the progression of COPD. Furthermore she did have centrilobular nodules in the past 2021 on CT chest that likely represents [facility].   []  Enrolled in low dose CT program  []  Nicotine Patch and Lozenges prescribed.   Return in about 6 months (around 01/03/2024).  I spent 60 minutes caring for this patient today, including preparing to see the patient, obtaining a medical history , reviewing a separately obtained history, performing a medically appropriate examination and/or evaluation, counseling and educating the patient/family/caregiver, ordering medications, tests, or procedures, documenting clinical information in the electronic health record, and independently interpreting results (not separately reported/billed) and communicating results to the patient/family/caregiver  Janann Colonel, MD [facility] 07/03/2023 2:11 PM

## 2023-07-11 ENCOUNTER — Ambulatory Visit: Admission: RE | Admit: 2023-07-11 | Payer: BC Managed Care – PPO | Source: Ambulatory Visit

## 2023-07-11 DIAGNOSIS — Z87891 Personal history of nicotine dependence: Secondary | ICD-10-CM | POA: Diagnosis not present

## 2023-07-11 DIAGNOSIS — F17210 Nicotine dependence, cigarettes, uncomplicated: Secondary | ICD-10-CM | POA: Insufficient documentation

## 2023-07-14 ENCOUNTER — Other Ambulatory Visit: Payer: Self-pay | Admitting: Family Medicine

## 2023-07-14 DIAGNOSIS — E7800 Pure hypercholesterolemia, unspecified: Secondary | ICD-10-CM

## 2023-07-18 ENCOUNTER — Other Ambulatory Visit: Payer: Self-pay | Admitting: Acute Care

## 2023-07-18 DIAGNOSIS — Z87891 Personal history of nicotine dependence: Secondary | ICD-10-CM

## 2023-07-18 DIAGNOSIS — Z122 Encounter for screening for malignant neoplasm of respiratory organs: Secondary | ICD-10-CM

## 2023-08-27 ENCOUNTER — Other Ambulatory Visit: Payer: Self-pay | Admitting: Family Medicine

## 2023-08-27 NOTE — Telephone Encounter (Signed)
Requested Prescriptions  Pending Prescriptions Disp Refills   montelukast (SINGULAIR) 10 MG tablet [Pharmacy Med Name: MONTELUKAST 10MG  TABLETS] 30 tablet 3    Sig: TAKE 1 TABLET(10 MG) BY MOUTH AT BEDTIME     Pulmonology:  Leukotriene Inhibitors Passed - 08/27/2023 10:08 AM      Passed - Valid encounter within last 12 months    Recent Outpatient Visits           4 months ago Chronic obstructive pulmonary disease, unspecified COPD type [facility])   [place] [facility] Merita Norton T, FNP   4 months ago Acute exacerbation of chronic obstructive pulmonary disease (COPD) (HCC)   [place] [facility] Mecum, Oswaldo Conroy, PA-C   4 months ago No-show for appointment   [facility] Jacky Kindle, FNP   4 months ago No-show for appointment   [facility] Jacky Kindle, FNP   5 months ago Hospital discharge follow-up   [facility] Jacky Kindle, [place]

## 2023-09-03 ENCOUNTER — Other Ambulatory Visit: Payer: Self-pay | Admitting: Family Medicine

## 2023-09-03 NOTE — Telephone Encounter (Signed)
Requested Prescriptions  Pending Prescriptions Disp Refills   promethazine-dextromethorphan (PROMETHAZINE-DM) 6.25-15 MG/5ML syrup [Pharmacy Med Name: PROMETHAZINE DM SYRUP] 118 mL 0    Sig: TAKE 5 ML BY MOUTH AT BEDTIME     Ear, Nose, and Throat:  Antitussives/Expectorants Passed - 09/03/2023 11:57 AM      Passed - Valid encounter within last 12 months    Recent Outpatient Visits           4 months ago Chronic obstructive pulmonary disease, unspecified COPD type [facility]   [place] [facility] Merita Norton T, FNP   4 months ago Acute exacerbation of chronic obstructive pulmonary disease (COPD) (HCC)   [place] [facility] Mecum, Oswaldo Conroy, PA-C   4 months ago No-show for appointment   [facility] Merita Norton T, FNP   4 months ago No-show for appointment   [facility] Jacky Kindle, FNP   5 months ago Hospital discharge follow-up   [facility] Jacky Kindle, [place]

## 2023-09-27 ENCOUNTER — Other Ambulatory Visit: Payer: Self-pay | Admitting: Family Medicine

## 2023-09-29 NOTE — Telephone Encounter (Signed)
Requested medications are due for refill today.  unsure  Requested medications are on the active medications list.  yes  Last refill. 09/03/2023 0 rf  Future visit scheduled.   no  Notes to clinic.  Please review for refill.     Requested Prescriptions  Pending Prescriptions Disp Refills   promethazine-dextromethorphan (PROMETHAZINE-DM) 6.25-15 MG/5ML syrup [Pharmacy Med Name: PROMETHAZINE DM SYRUP] 118 mL 0    Sig: TAKE 5 ML BY MOUTH AT BEDTIME     Ear, Nose, and Throat:  Antitussives/Expectorants Passed - 09/27/2023  5:39 PM      Passed - Valid encounter within last 12 months    Recent Outpatient Visits           5 months ago Chronic obstructive pulmonary disease, unspecified COPD type [facility])   [place] [facility] Merita Norton T, FNP   5 months ago Acute exacerbation of chronic obstructive pulmonary disease (COPD) (HCC)   [place] [facility] Mecum, Oswaldo Conroy, PA-C   5 months ago No-show for appointment   [facility] Jacky Kindle, FNP   5 months ago No-show for appointment   [facility] Jacky Kindle, FNP   6 months ago Hospital discharge follow-up   [facility] Jacky Kindle, [place]

## 2023-10-24 ENCOUNTER — Other Ambulatory Visit: Payer: Self-pay

## 2023-10-24 DIAGNOSIS — J42 Unspecified chronic bronchitis: Secondary | ICD-10-CM

## 2023-10-24 MED ORDER — ALBUTEROL SULFATE HFA 108 (90 BASE) MCG/ACT IN AERS: 2.0000 | INHALATION_SPRAY | Freq: Four times a day (QID) | RESPIRATORY_TRACT | 6 refills | Status: DC | PRN

## 2023-10-26 ENCOUNTER — Other Ambulatory Visit: Payer: Self-pay | Admitting: Family Medicine

## 2023-10-26 DIAGNOSIS — E7800 Pure hypercholesterolemia, unspecified: Secondary | ICD-10-CM

## 2023-10-29 ENCOUNTER — Other Ambulatory Visit: Payer: Self-pay | Admitting: Family Medicine

## 2023-10-29 DIAGNOSIS — E7800 Pure hypercholesterolemia, unspecified: Secondary | ICD-10-CM

## 2023-12-09 DIAGNOSIS — Z8709 Personal history of other diseases of the respiratory system: Secondary | ICD-10-CM | POA: Diagnosis not present

## 2023-12-09 DIAGNOSIS — J209 Acute bronchitis, unspecified: Secondary | ICD-10-CM | POA: Diagnosis not present

## 2024-01-30 ENCOUNTER — Other Ambulatory Visit: Payer: Self-pay | Admitting: Family Medicine

## 2024-01-30 DIAGNOSIS — E7800 Pure hypercholesterolemia, unspecified: Secondary | ICD-10-CM

## 2024-01-30 NOTE — Telephone Encounter (Signed)
 Copied from CRM [id number]. Topic: Clinical - Medication Refill >> Jan 30, 2024 11:20 AM Nyra Capes wrote: Most Recent Primary Care Visit:  Provider: Merita Norton T  Department: [id number]  Visit Type: OFFICE VISIT  Date: 04/25/2023  Medication: rosuvastatin (CRESTOR) 10 MG tablet patient has 1 pill left patients needs this refilled today.  montelukast (SINGULAIR) 10 MG tablet  patient has been out of this for 3 weeks  Has the patient contacted their pharmacy? Yes (Agent: If no, request that the patient contact the pharmacy for the refill. If patient does not wish to contact the pharmacy document the reason why and proceed with request.) (Agent: If yes, when and what did the pharmacy advise?)  Is this the correct pharmacy for this prescription? Yes If no, delete pharmacy and type the correct one.  This is the patient's preferred pharmacy:   [id number] - [place], [place] - [address] AT [facility] [address] [place] [address] [place] [address] Phone: [phone] Fax: [phone]  [facility] [address]), [place] - [address]) [place] [address] Phone: [phone] Fax: [phone]  [id number] [place], [place] - [address] [place] [address] Phone: [phone] Fax: [phone]   Has the prescription been filled recently? Yes  Is the patient out of the medication? Yes  Has the patient been seen for an appointment in the last year OR does the patient have an upcoming appointment? No   last appt with Merita Norton was 06/24  Can we respond through MyChart? Yes  Agent: Please be advised that Rx refills may take up to 3 business days. We ask that you follow-up with your pharmacy.  Pharmacy needs prior authorization before filling Rx

## 2024-01-30 NOTE — Telephone Encounter (Signed)
 Requested medication (s) are due for refill today: yes  Requested medication (s) are on the active medication list: yes  Last refill:  08/27/23 #30/3 montelukast, rosuvastatin 10/27/23 #90/0  Future visit scheduled: yes with Edmon Crape, PA   Notes to clinic:  Robynn Pane, NP pt     Requested Prescriptions  Pending Prescriptions Disp Refills   rosuvastatin (CRESTOR) 10 MG tablet 90 tablet 0     Cardiovascular:  Antilipid - Statins 2 Failed - 01/30/2024  5:43 PM      Failed - Lipid Panel in normal range within the last 12 months    Cholesterol, Total  Date Value Ref Range Status  12/19/2022 226 (H) 100 - 199 mg/dL Final   LDL Chol Calc (NIH)  Date Value Ref Range Status  12/19/2022 85 0 - 99 mg/dL Final   HDL  Date Value Ref Range Status  12/19/2022 107 >39 mg/dL Final   Triglycerides  Date Value Ref Range Status  12/19/2022 211 (H) 0 - 149 mg/dL Final         Passed - Cr in normal range and within 360 days    Creat  Date Value Ref Range Status  02/15/2016 0.82 0.50 - 1.05 mg/dL Final   Creatinine, Ser  Date Value Ref Range Status  03/08/2023 0.76 0.44 - 1.00 mg/dL Final         Passed - Patient is not pregnant      Passed - Valid encounter within last 12 months    Recent Outpatient Visits           9 months ago Chronic obstructive pulmonary disease, unspecified COPD type (HCC)   [place] [facility] Merita Norton T, FNP   9 months ago Acute exacerbation of chronic obstructive pulmonary disease (COPD) (HCC)   [place] [facility] Mecum, Oswaldo Conroy, PA-C   9 months ago No-show for appointment   [facility] Jacky Kindle, FNP   9 months ago No-show for appointment   [facility] Jacky Kindle, FNP   10 months ago Hospital discharge follow-up   [facility] Merita Norton T, FNP               montelukast (SINGULAIR) 10 MG tablet 30 tablet 3     Pulmonology:   Leukotriene Inhibitors Passed - 01/30/2024  5:43 PM      Passed - Valid encounter within last 12 months    Recent Outpatient Visits           9 months ago Chronic obstructive pulmonary disease, unspecified COPD type [facility])   [place] [facility] Merita Norton T, FNP   9 months ago Acute exacerbation of chronic obstructive pulmonary disease (COPD) (HCC)   [place] [facility] Mecum, Oswaldo Conroy, PA-C   9 months ago No-show for appointment   [facility] Jacky Kindle, FNP   9 months ago No-show for appointment   [facility] Jacky Kindle, FNP   10 months ago Hospital discharge follow-up   [facility] Jacky Kindle, [place]

## 2024-02-02 MED ORDER — ROSUVASTATIN CALCIUM 10 MG PO TABS: 10.0000 mg | ORAL_TABLET | Freq: Every day | ORAL | 0 refills | Status: DC

## 2024-02-02 MED ORDER — MONTELUKAST SODIUM 10 MG PO TABS: 10.0000 mg | ORAL_TABLET | Freq: Every day | ORAL | 0 refills | Status: DC

## 2024-02-24 ENCOUNTER — Ambulatory Visit: Admitting: Physician Assistant

## 2024-05-24 ENCOUNTER — Ambulatory Visit: Payer: Self-pay | Admitting: Family Medicine

## 2024-05-24 NOTE — Telephone Encounter (Signed)
 FYI Only or Action Required?: FYI only for provider.  Patient was last seen in primary care on 04/25/2023 by Emilio Kelly DASEN, FNP. Called Nurse Triage reporting Hypertension. Symptoms began several days ago. Interventions attempted: Nothing. Symptoms are: unchanged.  Triage Disposition: See PCP When Office is Open (Within 3 Days)  Patient/caregiver understands and will follow disposition?: Yes                       Copied from CRM [id number]. Topic: Clinical - Red Word Triage >> May 24, 2024 11:49 AM Charlet HERO wrote: Red Word that prompted transfer to Nurse Triage: Patient Is callig about blood pressure being 172/102 she is stating that she has headaches and she has not had meds for a while. The patient is also for new pcp at  [facility] family practice. Reason for Disposition  Systolic BP  >= 160 OR Diastolic >= 100  Answer Assessment - Initial Assessment Questions This RN educated pt on new-worsening symptoms and when to call back/seek emergent care. Pt verbalized understanding and agrees to plan.     BLOOD PRESSURE: What is the blood pressure? Did you take at least two measurements 5 minutes apart?     173/104  ONSET: When did you take your blood pressure?     Now HOW: How did you take your blood pressure? (e.g., automatic home BP monitor, visiting nurse)     Automatic home BP monitor HISTORY: Do you have a history of high blood pressure?     Yes MEDICINES: Are you taking any medicines for blood pressure? Have you missed any doses recently?     Pt states her previous doctor left the facility and she has been off her BP medications for a couple of months; rosuvastatin  10 mg  and losartan  hydrochlorothiazide 100-25 mg OTHER SYMPTOMS: Do you have any symptoms? (e.g., blurred vision, chest pain, difficulty breathing, headache, weakness)     Fatigue for several months   Denies blurred vision, chest pain, headache  Protocols used: Blood Pressure -  High-A-AH

## 2024-05-26 ENCOUNTER — Encounter: Payer: Self-pay | Admitting: Family Medicine

## 2024-05-26 ENCOUNTER — Ambulatory Visit: Payer: Self-pay | Admitting: Family Medicine

## 2024-05-26 VITALS — BP 160/100 | HR 93 | Resp 16 | Ht 64.0 in | Wt 135.0 lb

## 2024-05-26 DIAGNOSIS — I10 Essential (primary) hypertension: Secondary | ICD-10-CM

## 2024-05-26 DIAGNOSIS — E7800 Pure hypercholesterolemia, unspecified: Secondary | ICD-10-CM

## 2024-05-26 DIAGNOSIS — J449 Chronic obstructive pulmonary disease, unspecified: Secondary | ICD-10-CM

## 2024-05-26 MED ORDER — ROSUVASTATIN CALCIUM 10 MG PO TABS: 10.0000 mg | ORAL_TABLET | Freq: Every day | ORAL | 1 refills | Status: DC

## 2024-05-26 MED ORDER — LOSARTAN POTASSIUM-HCTZ 100-25 MG PO TABS: 1.0000 | ORAL_TABLET | Freq: Every day | ORAL | 1 refills | Status: DC

## 2024-05-26 NOTE — Progress Notes (Signed)
 Established patient visit   Patient: Alexa Thompson   DOB: [birth year]   63 y.o. Female  MRN: [id number] Visit Date: 05/26/2024  Today's healthcare provider: Nancyann Perry, MD   Chief Complaint  Patient presents with   Hypertension    Patient reports pressure being 172/102-183/106 has not had meds for a while. Symptoms: headaches, overheated   Medication Refill    Blood medicine and cholesterol, montelukast    Subjective    HPI Has been out of losartan -hydrochlorothiazide and rosuvastatin  for several months since losing her job and insurance. States medications worked well and had no adverse effects, and she wishes to start back on same medications.   She has also been on Trelegy for COPD but states she did not like it because it was powdery. She uses albuterol  occasionally, but doesn't want to be back on maintenance inhaler at this time.   Medications: Outpatient Medications Prior to Visit  Medication Sig   nicotine  (NICODERM CQ  - DOSED IN MG/24 HR) 7 mg/24hr patch Place 1 patch (7 mg total) onto the skin daily. (Not taking)   albuterol  (VENTOLIN  HFA) 108 (90 Base) MCG/ACT inhaler Inhale 2 puffs into the lungs every 6 (six) hours as needed for wheezing or shortness of breath. (Patient not taking: Reported on 05/26/2024)   Fluticasone -Umeclidin-Vilant (TRELEGY ELLIPTA ) 100-62.5-25 MCG/ACT AEPB Inhale 1 puff into the lungs daily. (Not taking)   montelukast  (SINGULAIR ) 10 MG tablet Take 1 tablet (10 mg total) by mouth at bedtime. (Patient not taking: Reported on 05/26/2024)   nicotine  polacrilex (NICOTINE  MINI) 2 MG lozenge Take 1 lozenge (2 mg total) by mouth as needed for smoking cessation.   losartan -hydrochlorothiazide (HYZAAR) 100-25 MG tablet Take 1 tablet by mouth daily.   rosuvastatin  (CRESTOR ) 10 MG tablet Take 1 tablet (10 mg total) by mouth daily.   No facility-administered medications prior to visit.      Objective    BP (!) 160/100 (BP Location: Left Arm, Patient  Position: Sitting, Cuff Size: Normal)   Pulse 93   Resp 16   Ht 5' 4 (1.626 m)   Wt 135 lb (61.2 kg)   LMP  (LMP Unknown)   SpO2 98%   BMI 23.17 kg/m    Physical Exam   General: Appearance:    Well developed, well nourished female in no acute distress  Eyes:    PERRL, conjunctiva/corneas clear, EOM's intact       Lungs:     Clear to auscultation bilaterally, respirations unlabored  Heart:    Normal heart rate. Normal rhythm. No murmurs, rubs, or gallops.    MS:   All extremities are intact.    Neurologic:   Awake, alert, oriented x 3. No apparent focal neurological defect.         Assessment & Plan     1. Elevated LDL cholesterol level Out of all medications due to lack of insurance. Will use GoodRx card to get back on medications and [facility].   Start back on rosuvastatin  (CRESTOR ) 10 MG tablet; Take 1 tablet (10 mg total) by mouth daily.  Dispense: 90 tablet; Refill: 1  2. Essential hypertension, benign (Primary) Start back on losartan -hydrochlorothiazide (HYZAAR) 100-25 MG tablet; Take 1 tablet by mouth daily.  Dispense: 90 tablet; Refill: 1   3. Chronic obstructive pulmonary disease, unspecified COPD type (HCC) Currently off of maintenance inhaler as she did not like powdery consistency of Trelegy. Can continue with prn albuterol  for now.    Return in  about 1 month (around 06/26/2024) for Hypertension.         Nancyann Perry, MD  [facility] [phone] (phone) [phone] (fax)  [facility]

## 2024-05-26 NOTE — Patient Instructions (Signed)
 Marland Kitchen  Please review the attached list of medications and notify my office if there are any errors.   . Please bring all of your medications to every appointment so we can make sure that our medication list is the same as yours.

## 2024-06-30 ENCOUNTER — Ambulatory Visit: Payer: Self-pay | Admitting: Family Medicine

## 2024-07-22 ENCOUNTER — Other Ambulatory Visit: Payer: Self-pay | Admitting: Acute Care

## 2024-07-22 ENCOUNTER — Telehealth: Payer: Self-pay

## 2024-07-22 DIAGNOSIS — Z122 Encounter for screening for malignant neoplasm of respiratory organs: Secondary | ICD-10-CM

## 2024-07-22 DIAGNOSIS — Z87891 Personal history of nicotine dependence: Secondary | ICD-10-CM

## 2024-07-22 NOTE — Telephone Encounter (Signed)
 Pt returned call. Updated her on the grant program. She confirms that she has no insurance. Pt will call back next week once she has her work schedule to reschedule CT for GI. 9/8 appt at [facility] cancelled.

## 2024-07-22 NOTE — Telephone Encounter (Signed)
 LVM to call office and discuss the [facility]. Pt is scheduled for a LDCT on 07/26/2024. Staff advised patient is self pay and wanted the [facility] program to outreach the patient and discuss options if patient is unable to pay for her scan. Appt would need to be moved to [facility] for the grant program. Will discuss insurance with patient at call back and discuss options.

## 2024-07-23 ENCOUNTER — Ambulatory Visit: Payer: Self-pay | Admitting: Family Medicine

## 2024-07-26 ENCOUNTER — Ambulatory Visit: Payer: Self-pay

## 2024-07-29 ENCOUNTER — Telehealth: Payer: Self-pay

## 2024-07-29 NOTE — Telephone Encounter (Signed)
 Blue card mailed to patient 07/29/2024 for LDCT.

## 2024-08-12 ENCOUNTER — Ambulatory Visit
Admission: RE | Admit: 2024-08-12 | Discharge: 2024-08-12 | Disposition: A | Discharge status: Home / Self Care | Payer: Self-pay | Source: Ambulatory Visit | Attending: Acute Care | Admitting: Acute Care

## 2024-08-12 DIAGNOSIS — Z122 Encounter for screening for malignant neoplasm of respiratory organs: Secondary | ICD-10-CM

## 2024-08-12 DIAGNOSIS — Z87891 Personal history of nicotine dependence: Secondary | ICD-10-CM

## 2024-08-20 ENCOUNTER — Other Ambulatory Visit: Payer: Self-pay | Admitting: Acute Care

## 2024-08-20 DIAGNOSIS — Z122 Encounter for screening for malignant neoplasm of respiratory organs: Secondary | ICD-10-CM

## 2024-08-20 DIAGNOSIS — F17210 Nicotine dependence, cigarettes, uncomplicated: Secondary | ICD-10-CM

## 2024-08-20 DIAGNOSIS — Z87891 Personal history of nicotine dependence: Secondary | ICD-10-CM

## 2024-09-06 ENCOUNTER — Ambulatory Visit: Payer: Self-pay

## 2024-09-06 NOTE — Telephone Encounter (Signed)
 FYI Only or Action Required?: FYI only for provider.  Patient was last seen in primary care on 05/26/2024 by Gasper Nancyann BRAVO, MD.  Called Nurse Triage reporting Abdominal Pain.  Symptoms began several days ago.  Interventions attempted: OTC medications: Tums with relief.  Symptoms are: unchanged.  Triage Disposition: See Physician Within 24 Hours  Patient/caregiver understands and will follow disposition?: Yes          Copied from CRM #[id number]. Topic: Clinical - Red Word Triage >> Sep 06, 2024 10:52 AM Shanda MATSU wrote: Red Word that prompted transfer to Nurse Triage: Patient reporting pain in stomach from belly button to top of stomach, indigestion, nausea, fatigue, has been having these symptoms since Friday. Reason for Disposition  [1] MODERATE pain (e.g., interferes with normal activities) AND [2] pain comes and goes (cramps) AND [3] present > 24 hours  (Exception: Pain with Vomiting or Diarrhea - see that Guideline.)    Pt prefers appt on Wednesday d/t financial concerns. [facility]ager strongly advised ED for any red flag symptoms. Patient verbalized understanding and to call back/go to ED with worsening symptoms.  Answer Assessment - Initial Assessment Questions 1. LOCATION: Where does it hurt?      Upper abd to umbilicus 2. RADIATION: Does the pain shoot anywhere else? (e.g., chest, back)     denies 3. ONSET: When did the pain begin? (e.g., minutes, hours or days ago)      Friday 4. SUDDEN: Gradual or sudden onset?     sudden 5. PATTERN Does the pain come and go, or is it constant?     Constant, now comes and goes 6. SEVERITY: How bad is the pain?  (e.g., Scale 1-10; mild, moderate, or severe)     Uncomfortable  9/10 with episodes, currently no pain 7. RECURRENT SYMPTOM: Have you ever had this type of stomach pain before? If Yes, ask: When was the last time? and What happened that time?      First time.  8. CAUSE: What do you think is causing the  stomach pain? (e.g., gallstones, recent abdominal surgery)     unknown 9. RELIEVING/AGGRAVATING FACTORS: What makes it better or worse? (e.g., antacids, bending or twisting motion, bowel movement)     Tums with some relief 10. OTHER SYMPTOMS: Do you have any other symptoms? (e.g., back pain, diarrhea, fever, urination pain, vomiting)       Nausea, indigestion, endorses warmth. 11. PREGNANCY: Is there any chance you are pregnant? When was your last menstrual period?       N/a  Protocols used: Abdominal Pain - Female-A-AH

## 2024-09-08 ENCOUNTER — Ambulatory Visit: Payer: Self-pay | Admitting: Family Medicine

## 2024-09-27 ENCOUNTER — Ambulatory Visit (INDEPENDENT_AMBULATORY_CARE_PROVIDER_SITE_OTHER): Payer: Self-pay | Admitting: Family Medicine

## 2024-09-27 ENCOUNTER — Encounter: Payer: Self-pay | Admitting: Family Medicine

## 2024-09-27 VITALS — BP 138/96 | HR 77 | Wt 139.0 lb

## 2024-09-27 DIAGNOSIS — R1013 Epigastric pain: Secondary | ICD-10-CM

## 2024-09-27 DIAGNOSIS — J42 Unspecified chronic bronchitis: Secondary | ICD-10-CM

## 2024-09-27 MED ORDER — ALBUTEROL SULFATE HFA 108 (90 BASE) MCG/ACT IN AERS: 2.0000 | INHALATION_SPRAY | Freq: Four times a day (QID) | RESPIRATORY_TRACT | 6 refills | Status: AC | PRN

## 2024-09-27 NOTE — Progress Notes (Signed)
 Established patient visit   Patient: Alexa Thompson   DOB: [birth year]   64 y.o. Female  MRN: [id number] Visit Date: 09/27/2024  Today's healthcare provider: Nancyann Perry, MD   Chief Complaint  Patient presents with   Abdominal Pain    Patient reports having loose stools off an on, but is having Bowel movement more frequently, specially after eating. Associated symptoms: poor appetite, bleated, mucus in stool. Abdominal pain started 2 weeks ago with nausea in the middle of abdomen. Reports no more pain but still having the other symptoms. She has been taking some TUMS and took Pepto one time.   Subjective    Discussed the use of AI scribe software for clinical note transcription with the patient, who gave verbal consent to proceed.  History of Present Illness   Alexa Thompson is a 64 year old female who presents with recent onset of severe abdominal pain and gastrointestinal symptoms.  She began experiencing excruciating abdominal pain two weeks ago, located above the umbilicus, lasting for about three days. The pain was accompanied by nausea, chills, and a sensation of fever, although she did not measure her temperature. She found some relief with chewable Tums taken every four hours.  Since the onset of pain, she has had frequent bowel movements, occurring almost every time she eats. The stools are somewhat loose but not black, except for one instance after taking Pepto-Bismol. No blood has been observed in the stool. She feels full and bloated after eating, but the pain does not return with meals.  Her past medical history includes an appendectomy and a hernia repair, which has left a knot in her abdomen that sometimes flares up. The knot becomes more prominent when she coughs.  She consumes alcohol daily, typically four glasses of wine or sometimes beer. She occasionally takes ibuprofen or generic aspirin for back pain due to standing on concrete at her part-time job as a  conservation officer, nature at [employer].       Medications: Outpatient Medications Prior to Visit  Medication Sig   losartan -hydrochlorothiazide (HYZAAR) 100-25 MG tablet Take 1 tablet by mouth daily.   rosuvastatin  (CRESTOR ) 10 MG tablet Take 1 tablet (10 mg total) by mouth daily.   Fluticasone -Umeclidin-Vilant (TRELEGY ELLIPTA ) 100-62.5-25 MCG/ACT AEPB Inhale 1 puff into the lungs daily.   No facility-administered medications prior to visit.   Review of Systems     Objective    BP (!) 138/96 (BP Location: Left Arm, Patient Position: Sitting, Cuff Size: Normal)   Pulse 77   Wt 139 lb (63 kg)   LMP  (LMP Unknown)   SpO2 97%   BMI 23.86 kg/m   Physical Exam   General Appearance:    Well developed, well nourished female, alert, cooperative, in no acute distress  Eyes:    PERRL, conjunctiva/corneas clear, EOM's intact       Lungs:     Clear to auscultation bilaterally, respirations unlabored  Heart:    Normal heart rate. Normal rhythm. No murmurs, rubs, or gallops.    Abdomen:   bowel sounds present and normal in all 4 quadrants, soft, nTender epigastrium. No rebound or guarding . No CVA tenderness       Assessment & Plan        Epigastric pain with nausea and loose stools, possible gastritis, peptic ulcer, or pancreatitis Acute epigastric pain with nausea and loose stools, improved but persistent. Differential includes gastritis, peptic ulcer, pancreatitis. Alcohol and NSAID use may  contribute. Discussed H. pylori infection and treatability. Emphasized reducing alcohol intake. - Ordered blood tests and H. pylori breath test. - Advised to keep Tums for symptomatic relief. - Counseled on reducing alcohol intake.  Unspecified chronic bronchitis Reports nocturnal wheezing. Requested albuterol  refill. - Provided prescription for albuterol  inhaler. - Advised to compare pharmacy prices for cost management.    No follow-ups on file.     Nancyann Perry, MD  [facility] [phone] (phone) [phone] (fax)  [facility]

## 2024-09-28 ENCOUNTER — Ambulatory Visit: Payer: Self-pay | Admitting: Family Medicine

## 2024-09-28 LAB — H. PYLORI BREATH TEST: H pylori Breath Test: NEGATIVE

## 2024-09-28 LAB — LIPASE: Lipase: 34 U/L (ref 14–72)

## 2024-09-28 LAB — COMPREHENSIVE METABOLIC PANEL WITH GFR
ALT: 30 IU/L (ref 0–32)
AST: 46 IU/L — ABNORMAL HIGH (ref 0–40)
Albumin: 4.7 g/dL (ref 3.9–4.9)
Alkaline Phosphatase: 77 IU/L (ref 49–135)
BUN/Creatinine Ratio: 14 (ref 12–28)
BUN: 13 mg/dL (ref 8–27)
Bilirubin Total: 0.6 mg/dL (ref 0.0–1.2)
CO2: 25 mmol/L (ref 20–29)
Calcium: 10 mg/dL (ref 8.7–10.3)
Chloride: 96 mmol/L (ref 96–106)
Creatinine, Ser: 0.9 mg/dL (ref 0.57–1.00)
Globulin, Total: 2.9 g/dL (ref 1.5–4.5)
Glucose: 88 mg/dL (ref 70–99)
Potassium: 4.2 mmol/L (ref 3.5–5.2)
Sodium: 137 mmol/L (ref 134–144)
Total Protein: 7.6 g/dL (ref 6.0–8.5)
eGFR: 72 mL/min/1.73 (ref >59)

## 2024-10-11 ENCOUNTER — Ambulatory Visit: Payer: Self-pay

## 2024-10-11 ENCOUNTER — Ambulatory Visit (INDEPENDENT_AMBULATORY_CARE_PROVIDER_SITE_OTHER): Payer: Self-pay | Admitting: Physician Assistant

## 2024-10-11 ENCOUNTER — Encounter: Payer: Self-pay | Admitting: Physician Assistant

## 2024-10-11 VITALS — BP 131/88 | HR 99 | Temp 98.4°F | Resp 14 | Ht 64.0 in | Wt 141.4 lb

## 2024-10-11 DIAGNOSIS — J209 Acute bronchitis, unspecified: Secondary | ICD-10-CM

## 2024-10-11 DIAGNOSIS — F17210 Nicotine dependence, cigarettes, uncomplicated: Secondary | ICD-10-CM

## 2024-10-11 MED ORDER — AZITHROMYCIN 250 MG PO TABS: ORAL_TABLET | ORAL | 0 refills | Status: AC

## 2024-10-11 MED ORDER — BENZONATATE 200 MG PO CAPS: 200.0000 mg | ORAL_CAPSULE | Freq: Two times a day (BID) | ORAL | 0 refills | Status: DC | PRN

## 2024-10-11 MED ORDER — PREDNISONE 20 MG PO TABS: 20.0000 mg | ORAL_TABLET | Freq: Every day | ORAL | 0 refills | Status: DC

## 2024-10-11 NOTE — Progress Notes (Signed)
 " Established patient visit  Patient: Alexa Thompson   DOB: 1960/12/12   63 y.o. Female  MRN: [id number] Visit Date: 10/11/2024  Today's healthcare provider: Jolynn Spencer, PA-C   Chief Complaint  Patient presents with   Cough    Cough x 3-4 weeks last 2 weeks has worsen. Headaches. Otc: nyquil vapor cool, allergy pill.    Subjective      Discussed the use of AI scribe software for clinical note transcription with the patient, who gave verbal consent to proceed.  History of Present Illness Alexa Thompson is a 63 year old female with COPD who presents with a persistent cough.  She has had a gradually worsening productive cough for three to four weeks with thick mucus that disrupts her sleep. She has intermittent wheezing, sore throat, headache, post-nasal drainage, and congestion. She notes slightly increased shortness of breath compared with her baseline but no fever.  She has COPD and smokes about half a pack per day and is trying to quit with nicotine  patches. She uses an albuterol  inhaler but has limited alternatives due to insurance and avoids powder inhalers. She had similar bronchitis-like symptoms last year or the year before, which improved with Tessalon  Perles and prednisone . She is not allergic to steroids or prednisone  but had a burning sensation with Breztri , documented as an adverse reaction. She denies known exposure to COVID-19, flu, or strep and denies eye discharge or sinus pressure.     09/27/2024    1:23 PM 05/26/2024    4:07 PM 04/25/2023    1:28 PM  Depression screen PHQ 2/9  Decreased Interest 0 3 0  Down, Depressed, Hopeless 0 0 0  PHQ - 2 Score 0 3 0  Altered sleeping 1 3 1   Tired, decreased energy 1 2 1   Change in appetite 1 2 0  Feeling bad or failure about yourself  0 0 0  Trouble concentrating 0 0 0  Moving slowly or fidgety/restless 0 0 0  Suicidal thoughts 0 0 0  PHQ-9 Score 3 10  2    Difficult doing work/chores Not difficult at all Somewhat difficult  Not difficult at all     Data saved with a previous flowsheet row definition      09/27/2024    1:24 PM 05/26/2024    4:08 PM  GAD 7 : Generalized Anxiety Score  Nervous, Anxious, on Edge 0 1  Control/stop worrying 0 0  Worry too much - different things 0 0  Trouble relaxing 0 0  Restless 1 0  Easily annoyed or irritable 0 0  Afraid - awful might happen 0 0  Total GAD 7 Score 1 1  Anxiety Difficulty Not difficult at all Somewhat difficult    Medications: Outpatient Medications Prior to Visit  Medication Sig   albuterol  (VENTOLIN  HFA) 108 (90 Base) MCG/ACT inhaler Inhale 2 puffs into the lungs every 6 (six) hours as needed for wheezing or shortness of breath.   losartan -hydrochlorothiazide (HYZAAR) 100-25 MG tablet Take 1 tablet by mouth daily.   rosuvastatin  (CRESTOR ) 10 MG tablet Take 1 tablet (10 mg total) by mouth daily.   [DISCONTINUED] Fluticasone -Umeclidin-Vilant (TRELEGY ELLIPTA ) 100-62.5-25 MCG/ACT AEPB Inhale 1 puff into the lungs daily.   No facility-administered medications prior to visit.    Review of Systems All negative Except see HPI       Objective    BP 131/88   Pulse 99   Temp 98.4 F (36.9 C) (Oral)   Resp 14  Ht 5' 4 (1.626 m)   Wt 141 lb 6.4 oz (64.1 kg)   LMP  (LMP Unknown)   SpO2 97%   BMI 24.27 kg/m     Physical Exam Vitals reviewed.  Constitutional:      Appearance: She is normal weight.  HENT:     Head: Normocephalic and atraumatic.     Right Ear: Ear canal and external ear normal.     Left Ear: Ear canal and external ear normal.     Nose: Congestion and rhinorrhea present.     Mouth/Throat:     Pharynx: Posterior oropharyngeal erythema present.     Comments: Postnasal drainage noted Eyes:     General: No scleral icterus.       Right eye: No discharge.        Left eye: No discharge.     Extraocular Movements: Extraocular movements intact.     Pupils: Pupils are equal, round, and reactive to light.  Cardiovascular:      Rate and Rhythm: Normal rate and regular rhythm.  Pulmonary:     Effort: Pulmonary effort is normal.     Breath sounds: Normal breath sounds.  Abdominal:     General: Abdomen is flat. Bowel sounds are normal.     Palpations: Abdomen is soft.  Lymphadenopathy:     Cervical: No cervical adenopathy.  Neurological:     Mental Status: She is alert.      No results found for any visits on 10/11/24.      Assessment & Plan     Acute bronchitis with COPD (HCC) (Primary) COPD exacerbation with increased dyspnea and productive cough. Wheezing noted. Prefers prednisone  due to its effectiveness in past episodes. Denies having allergy/contraindication to prednisone . - Continue albuterol  inhaler. - Prescribed Tessalon  Perles for cough. - Prescribed prednisone  20 mg, 5 tablets, to be taken as directed. - Prescribed azithromycin  (Z-Pak) for potential bacterial infection. - Advised use of Flonase for nasal congestion. - Instructed to drink plenty of water. - Advised to contact if symptoms do not improve for potential chest x-ray. - benzonatate  (TESSALON ) 200 MG capsule; Take 1 capsule (200 mg total) by mouth 2 (two) times daily as needed for cough.  Dispense: 20 capsule; Refill: 0 - predniSONE  (DELTASONE ) 20 MG tablet; Take 1 tablet (20 mg total) by mouth daily with breakfast.  Dispense: 10 tablet; Refill: 0 - azithromycin  (ZITHROMAX ) 250 MG tablet; Take 2 tablets on day 1, then 1 tablet daily on days 2 through 5  Dispense: 6 tablet; Refill: 0  Cigarette nicotine  dependence without complications Chronic tobacco use, approximately half a pack per day. Acknowledges smoking contributes to respiratory issues and desires to quit. - Advised smoking cessation. - Discussed use of nicotine  patches as a cessation aid.  She is a self-pay patient No orders of the defined types were placed in this encounter.   No follow-ups on file.   The patient was advised to call back or seek an in-person evaluation  if the symptoms worsen or if the condition fails to improve as anticipated.  I discussed the assessment and treatment plan with the patient. The patient was provided an opportunity to ask questions and all were answered. The patient agreed with the plan and demonstrated an understanding of the instructions.  I, Brenen Beigel, PA-C have reviewed all documentation for this visit. The documentation on 10/11/2024  for the exam, diagnosis, procedures, and orders are all accurate and complete.  Jolynn Spencer, [facility], MMS [facility] [phone] (  phone) [phone] (fax)  [facility] "

## 2024-10-11 NOTE — Telephone Encounter (Signed)
 FYI Only or Action Required?: FYI only for provider: appointment scheduled on today.  Patient was last seen in primary care on 09/27/2024 by Gasper Nancyann BRAVO, MD.  Called Nurse Triage reporting Cough.  Symptoms began several weeks ago.  Symptoms are: gradually worsening.  Triage Disposition: See HCP Within 4 Hours (Or PCP Triage)  Patient/caregiver understands and will follow disposition?: Yes     Copied from CRM #[id number]. Topic: Clinical - Red Word Triage >> Oct 11, 2024 10:26 AM Alexa Thompson wrote: Red Word that prompted transfer to Nurse Triage:  Heavy wheezing, coughing, hoarse, sore ribs.       Reason for Disposition  [1] MILD difficulty breathing (e.g., minimal/no SOB at rest, SOB with walking, pulse < 100) AND [2] still present when not coughing  Answer Assessment - Initial Assessment Questions 1. ONSET: When did the cough begin?      Several weeks 2. SEVERITY: How bad is the cough today?      Moderate  3. SPUTUM: Describe the color of your sputum (e.g., none, dry cough; clear, white, yellow, green)     Green/yellow 4. HEMOPTYSIS: Are you coughing up any blood? If Yes, ask: How much? (e.g., flecks, streaks, tablespoons, etc.)     No 5. DIFFICULTY BREATHING: Are you having difficulty breathing? If Yes, ask: How bad is it? (e.g., mild, moderate, severe)      Mild  6. FEVER: Do you have a fever? If Yes, ask: What is your temperature, how was it measured, and when did it start?     No 7. CARDIAC HISTORY: Do you have any history of heart disease? (e.g., heart attack, congestive heart failure)      No 8. LUNG HISTORY: Do you have any history of lung disease?  (e.g., pulmonary embolus, asthma, emphysema)     Yes 9. PE RISK FACTORS: Do you have a history of blood clots? (or: recent major surgery, recent prolonged travel, bedridden)     No 10. OTHER SYMPTOMS: Do you have any other symptoms? (e.g., runny nose, wheezing, chest pain)        Intermittent wheezing, sore throat, rib pain from coughing  Protocols used: Cough - Acute Productive-A-AH

## 2024-10-27 ENCOUNTER — Ambulatory Visit: Payer: Self-pay

## 2024-10-27 ENCOUNTER — Other Ambulatory Visit (INDEPENDENT_AMBULATORY_CARE_PROVIDER_SITE_OTHER): Payer: Self-pay

## 2024-10-27 ENCOUNTER — Other Ambulatory Visit: Payer: Self-pay

## 2024-10-27 VITALS — BP 144/81 | HR 91 | Resp 16 | Ht 64.0 in | Wt 141.0 lb

## 2024-10-27 DIAGNOSIS — R1010 Upper abdominal pain, unspecified: Secondary | ICD-10-CM

## 2024-10-27 DIAGNOSIS — Z1231 Encounter for screening mammogram for malignant neoplasm of breast: Secondary | ICD-10-CM

## 2024-10-27 DIAGNOSIS — J449 Chronic obstructive pulmonary disease, unspecified: Secondary | ICD-10-CM

## 2024-10-27 DIAGNOSIS — I10 Essential (primary) hypertension: Secondary | ICD-10-CM

## 2024-10-27 DIAGNOSIS — K219 Gastro-esophageal reflux disease without esophagitis: Secondary | ICD-10-CM

## 2024-10-27 MED ORDER — BREZTRI AEROSPHERE 160-9-4.8 MCG/ACT IN AERO: 2.0000 | INHALATION_SPRAY | Freq: Two times a day (BID) | RESPIRATORY_TRACT | 11 refills | Status: AC

## 2024-10-27 NOTE — Progress Notes (Unsigned)
 New patient visit   Patient: Alexa Thompson   DOB: 1961-07-21   63 y.o. Female  MRN: [id number] Visit Date: 10/27/2024  Today's healthcare provider: Isaiah DELENA Pepper, MD   Chief Complaint  Patient presents with   transfer of care    TOC/  1 month f/u on Bronchitis / wants a Referral for GI due to stomach pain    Subjective    Alexa Thompson is a 63 y.o. female who presents today as a new patient to establish care.   Discussed the use of AI scribe software for clinical note transcription with the patient, who gave verbal consent to proceed.  History of Present Illness VANECIA Thompson is a 63 year old female with COPD who presents for follow-up on stomach pain and respiratory symptoms.  She experienced severe stomach pain lasting about three days, described as excruciating, though not a daily occurrence. She underwent an endoscopy and colonoscopy in 2020; during the colonoscopy, a precancerous polyp was found and removed. The endoscopy showed gastritis. She uses Tums as needed for relief from heartburn and indigestion, which she describes as a new symptom, taking it once a day or as needed.  She has a history of COPD and continues to experience a cough. She has not completely quit smoking but has reduced her intake. She uses albuterol  as needed and has previously used Trelegy but discontinued it due to cost. She also takes Lung Clear Pro drops to help clear mucus. Her mornings are the worst for coughing, but she has noticed some improvement in symptoms. She experiences wheezing, especially at night, and has noticed some yellow mucus when coughing.  She lives in a house with significant mold issues, which she believes has contributed to her respiratory problems. She is on a waitlist for a new apartment and hopes to move soon to improve her living conditions.  She has been taking blood pressure medication but has reduced the dose to half a pill due to concerns about low blood pressure  readings. She monitors her blood pressure regularly and reports readings that she finds acceptable with the reduced dose.  She consumes alcohol regularly, approximately equivalent to four glasses of wine almost daily, and acknowledges the need to cut back. She has previously quit drinking and smoking and believes she can do so again once her living situation improves.   Past Medical History:  Diagnosis Date   Allergy    COPD (chronic obstructive pulmonary disease) (HCC)    Heart murmur    Hypertension    Substance abuse [facility])    Past Surgical History:  Procedure Laterality Date   APPENDECTOMY  age 39ish   CESAREAN SECTION  age 18   COLONOSCOPY WITH PROPOFOL  N/A 05/11/2019   Procedure: COLONOSCOPY WITH PROPOFOL ;  Surgeon: Therisa Bi, MD;  Location: [facility] ENDOSCOPY;  Service: Gastroenterology;  Laterality: N/A;   COLONOSCOPY WITH PROPOFOL  N/A 01/13/2023   Procedure: COLONOSCOPY WITH PROPOFOL ;  Surgeon: Therisa Bi, MD;  Location[facility] ENDOSCOPY;  Service: Gastroenterology;  Laterality: N/A;   ESOPHAGOGASTRODUODENOSCOPY (EGD) WITH PROPOFOL  N/A 05/11/2019   Procedure: ESOPHAGOGASTRODUODENOSCOPY (EGD) WITH PROPOFOL ;  Surgeon: Therisa Bi, MD;  Location: [facility] ENDOSCOPY;  Service: Gastroenterology;  Laterality: N/A;   HERNIA REPAIR  age 89ish   Family Status  Relation Name Status   Mother  Deceased   Father  Alive   Neg Hx  (Not Specified)  No partnership data on file   Family History  Problem Relation Age of Onset  Kidney disease Mother    COPD Mother    Hypertension Mother    Diabetes Father    Heart disease Father        CHF   Breast cancer Neg Hx    Social History   Socioeconomic History   Marital status: Single    Spouse name: Not on file   Number of children: Not on file   Years of education: Not on file   Highest education level: GED or equivalent  Occupational History   Not on file  Tobacco Use   Smoking status: Every Day    Current packs/day: 1.50    Average  packs/day: 1.5 packs/day for 45.0 years (67.5 ttl pk-yrs)    Types: Cigarettes   Smokeless tobacco: Never   Tobacco comments:    0.5PPD 07/03/2023  Vaping Use   Vaping status: Never Used  Substance and Sexual Activity   Alcohol use: Yes    Alcohol/week: 4.0 standard drinks of alcohol    Types: 4 Cans of beer per week    Comment: previous heavy drinker October, 2016   Drug use: Not Currently    Types: Crack cocaine, Marijuana, Benzodiazepines, Other-see comments    Comment: recovering. last use of Cocaine was February 01, 2016   Sexual activity: Not Currently  Other Topics Concern   Not on file  Social History Narrative   Not on file   Social Drivers of Health   Tobacco Use: High Risk (10/27/2024)   Patient History    Smoking Tobacco Use: Every Day    Smokeless Tobacco Use: Never    Passive Exposure: Not on file  Financial Resource Strain: Medium Risk (09/26/2024)   Overall Financial Resource Strain (CARDIA)    Difficulty of Paying Living Expenses: Somewhat hard  Food Insecurity: No Food Insecurity (09/26/2024)   Epic    Worried About Programme Researcher, Broadcasting/film/video in the Last Year: Never true    Ran Out of Food in the Last Year: Never true  Transportation Needs: No Transportation Needs (09/26/2024)   Epic    Lack of Transportation (Medical): No    Lack of Transportation (Non-Medical): No  Physical Activity: Insufficiently Active (09/26/2024)   Exercise Vital Sign    Days of Exercise per Week: 2 days    Minutes of Exercise per Session: 30 min  Stress: No Stress Concern Present (09/26/2024)   [employer] of Occupational Health - Occupational Stress Questionnaire    Feeling of Stress: Only a little  Social Connections: Moderately Isolated (09/26/2024)   Social Connection and Isolation Panel    Frequency of Communication with Friends and Family: More than three times a week    Frequency of Social Gatherings with Friends and Family: Twice a week    Attends Religious Services: 1 to 4  times per year    Active Member of Clubs or Organizations: No    Attends Engineer, Structural: Not on file    Marital Status: Divorced  Depression (PHQ2-9): Low Risk (10/27/2024)   Depression (PHQ2-9)    PHQ-2 Score: 4  Alcohol Screen: High Risk (09/26/2024)   Alcohol Screen    Last Alcohol Screening Score (AUDIT): 20  Housing: Low Risk (09/26/2024)   Epic    Unable to Pay for Housing in the Last Year: No    Number of Times Moved in the Last Year: 0    Homeless in the Last Year: No  Utilities: Not on file  Health Literacy: Not on file   Outpatient  Medications Prior to Visit  Medication Sig   albuterol  (VENTOLIN  HFA) 108 (90 Base) MCG/ACT inhaler Inhale 2 puffs into the lungs every 6 (six) hours as needed for wheezing or shortness of breath.   rosuvastatin  (CRESTOR ) 10 MG tablet Take 1 tablet (10 mg total) by mouth daily.   [DISCONTINUED] losartan -hydrochlorothiazide (HYZAAR) 100-25 MG tablet Take 1 tablet by mouth daily.   [DISCONTINUED] benzonatate  (TESSALON ) 200 MG capsule Take 1 capsule (200 mg total) by mouth 2 (two) times daily as needed for cough. (Patient not taking: Reported on 10/27/2024)   [DISCONTINUED] predniSONE  (DELTASONE ) 20 MG tablet Take 1 tablet (20 mg total) by mouth daily with breakfast. (Patient not taking: Reported on 10/27/2024)   No facility-administered medications prior to visit.   Allergies  Allergen Reactions   Breztri  Aerosphere [Budeson-Glycopyrrol-Formoterol] Other (See Comments)    Burning in chest/airway    Reviews of Systems as noted in HPI.      Objective    BP (!) 144/81 (BP Location: Left Arm, Patient Position: Sitting, Cuff Size: Normal)   Pulse 91   Resp 16   Ht 5' 4 (1.626 m)   Wt 141 lb (64 kg)   LMP  (LMP Unknown)   SpO2 98%   BMI 24.20 kg/m     Physical Exam Constitutional:      Appearance: Normal appearance.  HENT:     Head: Normocephalic and atraumatic.     Mouth/Throat:     Mouth: Mucous membranes are  moist.  Eyes:     Pupils: Pupils are equal, round, and reactive to light.  Cardiovascular:     Rate and Rhythm: Normal rate and regular rhythm.     Heart sounds: Normal heart sounds.  Pulmonary:     Effort: Pulmonary effort is normal.     Breath sounds: Wheezing present.     Comments: Mild end expiratory wheezing present. Skin:    General: Skin is warm.  Neurological:     General: No focal deficit present.     Mental Status: She is alert.     Depression Screen    10/27/2024    1:41 PM 09/27/2024    1:23 PM 05/26/2024    4:07 PM 04/25/2023    1:28 PM  PHQ 2/9 Scores  PHQ - 2 Score 1 0 3 0  PHQ- 9 Score 4 3 10  2       Data saved with a previous flowsheet row definition   No results found for any visits on 10/27/24.  Assessment & Plan      Problem List Items Addressed This Visit       Cardiovascular and Mediastinum   Essential hypertension, benign   Relevant Medications   losartan -hydrochlorothiazide (HYZAAR) 100-25 MG tablet     Respiratory   Chronic obstructive pulmonary disease (HCC)     Digestive   Gastroesophageal reflux disease   Other Visit Diagnoses       Upper abdominal pain    -  Primary   Relevant Orders   Ambulatory referral to Gastroenterology     Screening mammogram for breast cancer       Relevant Orders   MM 3D SCREENING MAMMOGRAM BILATERAL BREAST      Assessment & Plan Upper abdominal pain GERD Patient with intermittent upper abdominal pain with new GERD symptoms. Negative for H.pylori, lipase normal, bilirubin normal. Concern for PUD, cholelithiasis, gastritis. - Referred to gastroenterology for evaluation and possible endoscopy. - Advised use of over-the-counter Pepcid for symptom management. -  Continue Tums as needed for symptom relief.  Chronic obstructive pulmonary disease (HCC) Chronic, uncontrolled. Patient is recovering from recent exacerbation. Persistent cough with occasional yellow sputum. Previous Trelegy use discontinued due  to cost. Environmental factors may exacerbate symptoms. - Continue albuterol  inhaler as needed. - Consulted pharmacist for cost-effective inhaler options given patient is uninsured. Will likely start Breztri . - Encouraged smoking cessation and relocation to improve respiratory health.  Hypertension Recent home readings show variability, prompting self-adjustment of dosage. - Continue current regimen of half-dose blood pressure medication. - Monitor blood pressure regularly at home. - Adjust medication dosage as needed based on blood pressure readings.  General health maintenance Due for routine screenings and preventive care. - Ordered mammogram and scheduled for January.   Return in about 6 weeks (around 12/08/2024) for Follow Up.      Isaiah DELENA Pepper, MD  [facility] [phone] (phone) [phone] (fax)

## 2024-10-27 NOTE — Progress Notes (Addendum)
 10/27/2024 Name: Alexa Thompson MRN: [id number] DOB: 10-26-1961  No chief complaint on file.   Alexa Thompson is a 63 y.o. year old female who was referred for medication management by their primary care provider, Franchot Isaiah LABOR, MD. They presented for a face to face visit today.   They were referred to the pharmacist by their PCP for assistance in managing medication access    Subjective:  Care Team: Primary Care Provider: Franchot Isaiah LABOR, MD  Medication Access/Adherence  Current Pharmacy:  [facility] [phone] [address], [place] - [address] [place] [address] Phone: [phone] Fax: [phone]  [facility] - [place], [place] - [address] [place] [address] Phone: [phone] Fax: [phone]  Patient is currently uninsured and is working to get insurance set up for 2026.  Patient reports affordability concerns with their medications: Yes  Patient reports access/transportation concerns to their pharmacy: No  Patient reports adherence concerns with their medications:  Yes  with her inhalers due to cost   COPD:  Current medications: albuterol  90 mcg/act 2 puffs into the lungs q6h prn SOB, Trelegy 100/62.5/25 mcg 1 puff daily (not taking) Medications tried in the past: Breztri  (reports a bit of irritation with use)  Objective:  Lab Results  Component Value Date   HGBA1C 5.7 (H) 12/19/2022    Lab Results  Component Value Date   CREATININE 0.90 09/27/2024   BUN 13 09/27/2024   NA 137 09/27/2024   K 4.2 09/27/2024   CL 96 09/27/2024   CO2 25 09/27/2024    Lab Results  Component Value Date   CHOL 226 (H) 12/19/2022   HDL 107 12/19/2022   LDLCALC 85 12/19/2022   TRIG 211 (H) 12/19/2022   CHOLHDL 2.1 12/19/2022    Medications Reviewed Today     Reviewed by Marsh, Ryley Teater E, RPH-CPP (Pharmacist) on 10/27/24 at 1633  Med List Status: <None>   Medication Order Taking? Sig Documenting Provider Last Dose Status  Informant  albuterol  (VENTOLIN  HFA) 108 (90 Base) MCG/ACT inhaler [id number]  Inhale 2 puffs into the lungs every 6 (six) hours as needed for wheezing or shortness of breath. Gasper Nancyann BRAVO, MD  Active   budesonide-glycopyrrolate-formoterol (BREZTRI  AEROSPHERE) 160-9-4.8 MCG/ACT AERO inhaler [id number] Yes Inhale 2 puffs into the lungs in the morning and at bedtime. Franchot Isaiah LABOR, MD  Active   losartan -hydrochlorothiazide (HYZAAR) 100-25 MG tablet [id number]  Take 1 tablet by mouth daily. Gasper Nancyann BRAVO, MD  Active   rosuvastatin  (CRESTOR ) 10 MG tablet [id number]  Take 1 tablet (10 mg total) by mouth daily. Gasper Nancyann BRAVO, MD  Active               Assessment/Plan:   COPD: - Currently uncontrolled.  - Discussed that Breztri  has a patient assistance program available. Discussed previous intolerance noted in chart to Breztri ; patient reports she experienced a bit of burning in the lungs, but felt she did not give this enough time to work (used a sample 14-day supply in 2023). She would like to trial again.  - Meets financial criteria for Breztri  patient assistance program through [place]. Will collaborate with provider, CPhT, and patient to pursue assistance.   MISC: Discussed that patient can use the [facility] via [facility] to obtain some medications at no charge. She does not need any refills of other medications at this time. Can discuss in the future, if  she remains uninsured when she is due for refills to verify stock of medications at [facility] pharmacy.    Follow Up Plan:  -Pharmacist on 12/03/24 -PCP on 11/29/24  Peyton CHARLENA Ferries, PharmD, CPP Clinical Pharmacist [facility] [phone]

## 2024-10-28 ENCOUNTER — Telehealth: Payer: Self-pay

## 2024-10-28 NOTE — Telephone Encounter (Signed)
 Received pap pt portion AZ&ME (Breztri ) waiting on provider portion.

## 2024-10-29 DIAGNOSIS — K219 Gastro-esophageal reflux disease without esophagitis: Secondary | ICD-10-CM | POA: Insufficient documentation

## 2024-10-29 MED ORDER — LOSARTAN POTASSIUM-HCTZ 100-25 MG PO TABS: 0.5000 | ORAL_TABLET | Freq: Every day | ORAL | 3 refills | Status: DC

## 2024-11-02 ENCOUNTER — Other Ambulatory Visit: Payer: Self-pay

## 2024-11-02 ENCOUNTER — Ambulatory Visit: Payer: Self-pay

## 2024-11-02 DIAGNOSIS — J441 Chronic obstructive pulmonary disease with (acute) exacerbation: Secondary | ICD-10-CM

## 2024-11-02 MED ORDER — PREDNISONE 20 MG PO TABS: 40.0000 mg | ORAL_TABLET | Freq: Every day | ORAL | 0 refills | Status: AC

## 2024-11-02 MED ORDER — BENZONATATE 100 MG PO CAPS: 200.0000 mg | ORAL_CAPSULE | Freq: Three times a day (TID) | ORAL | 0 refills | Status: DC | PRN

## 2024-11-02 MED ORDER — AZITHROMYCIN 500 MG PO TABS: 500.0000 mg | ORAL_TABLET | Freq: Every day | ORAL | 0 refills | Status: AC

## 2024-11-02 NOTE — Telephone Encounter (Signed)
 FYI Only or Action Required?: Action required by provider: update on patient condition.  Patient was last seen in primary care on 10/27/2024 by Franchot Isaiah LABOR, MD.  Called Nurse Triage reporting Cough.  Symptoms began several weeks ago.  Interventions attempted: OTC medications: therflu.  Symptoms are: unchanged.  Triage Disposition: See Physician Within 24 Hours  Patient/caregiver understands and will follow disposition?: No, wishes to speak with PCP  Copied from CRM #[id number]. Topic: Clinical - Red Word Triage >> Nov 02, 2024  1:28 PM Tiffini S wrote: [facility] that prompted transfer to Nurse Triage: cough, rib cage hurts with pain when coughing- taking OTC medication- symptoms are worsening due to COPD Reason for Disposition  [1] Known COPD or other severe lung disease (i.e., bronchiectasis, cystic fibrosis, lung surgery) AND [2] symptoms getting worse (i.e., increased sputum purulence or amount, increased breathing difficulty  Answer Assessment - Initial Assessment Questions 1. ONSET: When did the cough begin?      Been awhile 2. SEVERITY: How bad is the cough today?      Worse in morning and at night 3. SPUTUM: Describe the color of your sputum (e.g., none, dry cough; clear, white, yellow, green)     Clear and little brownish 4. HEMOPTYSIS: Are you coughing up any blood? If Yes, ask: How much? (e.g., flecks, streaks, tablespoons, etc.)     denies 5. DIFFICULTY BREATHING: Are you having difficulty breathing? If Yes, ask: How bad is it? (e.g., mild, moderate, severe)      denies 6. FEVER: Do you have a fever? If Yes, ask: What is your temperature, how was it measured, and when did it start?     denies 8. LUNG HISTORY: Do you have any history of lung disease?  (e.g., pulmonary embolus, asthma, emphysema)     COPD 10. OTHER SYMPTOMS: Do you have any other symptoms? (e.g., runny nose, wheezing, chest pain)       Rib cage pain from coughing  Pt was seen  on 12/10 and told to call back if not better. Pt states that she is not getting better, would like to know next steps. Please call pt back and advise.  Protocols used: Cough - Acute Productive-A-AH

## 2024-11-02 NOTE — Telephone Encounter (Signed)
 Advised

## 2024-11-02 NOTE — Telephone Encounter (Signed)
 Please call patient and let her know that I sent in a course of prednisone , azithromycin , and tessalon  perles for her. If she is feeling worse despite taking the medications, she should return to clinic for further evaluation.

## 2024-11-10 NOTE — Telephone Encounter (Signed)
 Filled and faxed provider portion to sign and date AZ&ME (Breztri )

## 2024-11-15 NOTE — Telephone Encounter (Signed)
 Faxed provider portion to PAP team.  Peyton CHARLENA Ferries, PharmD, CPP Clinical Pharmacist [facility] [phone]

## 2024-11-16 NOTE — Telephone Encounter (Signed)
 Received approval letter AZ&ME (Brezti)thru 11/17/2025 approval letter index.

## 2024-11-16 NOTE — Telephone Encounter (Signed)
 Received provide AZ&ME (Breztri ) from provider office. Faxed to AZ&ME along pt portion today.

## 2024-12-03 ENCOUNTER — Other Ambulatory Visit: Payer: Self-pay

## 2024-12-03 NOTE — Progress Notes (Unsigned)
" ° °  12/03/2024 Name: Alexa Thompson MRN: [id number] DOB: [birth year]  No chief complaint on file.   Alexa Thompson is a 64 y.o. year old female who was referred for medication management by their primary care provider, Alexa Isaiah LABOR, MD. They presented for a face to face visit today.   They were referred to the pharmacist by their PCP for assistance in managing medication access    Subjective:  Care Team: Primary Care Provider: Franchot Isaiah LABOR, MD  Medication Access/Adherence  [facility]acy[facility]  [facility] [phone] [address], [place] - [address] [place] [address] Phone: [phone] Fax: [phone]  [facility] - [place], [place] - [address] [place] [address] Phone: [phone] Fax: [phone]  Patient is currently uninsured and is working to get insurance set up for 2026.  Patient reports affordability concerns with their medications: Yes  Patient reports access/transportation concerns to their pharmacy: No  Patient reports adherence concerns with their medications:  Yes  with her inhalers due to cost   COPD:  Current medications: albuterol  90 mcg/act 2 puffs into the lungs q6h prn SOB, Trelegy 100/62.5/25 mcg 1 puff daily (not taking) Medications tried in the past: Breztri  (reports a bit of irritation with use)  Objective:  Lab Results  Component Value Date   HGBA1C 5.7 (H) 12/19/2022    Lab Results  Component Value Date   CREATININE 0.90 09/27/2024   BUN 13 09/27/2024   NA 137 09/27/2024   K 4.2 09/27/2024   CL 96 09/27/2024   CO2 25 09/27/2024    Lab Results  Component Value Date   CHOL 226 (H) 12/19/2022   HDL 107 12/19/2022   LDLCALC 85 12/19/2022   TRIG 211 (H) 12/19/2022   CHOLHDL 2.1 12/19/2022    Medications Reviewed Today   Medications were not reviewed in this encounter       Assessment/Plan:   COPD: - Currently uncontrolled.  - Discussed that Breztri  has a patient assistance program  available. Discussed previous intolerance noted in chart to Breztri ; patient reports she experienced a bit of burning in the lungs, but felt she did not give this enough time to work (used a sample 14-day supply in 2023). She would like to trial again.  - Meets financial criteria for Breztri  patient assistance program through [employer]&Me. Will collaborate with provider, CPhT, and patient to pursue assistance.   MISC: Discussed that patient can use the [facility] via [facility] to obtain some medications at no charge. She does not need any refills of other medications at this time. Can discuss in the future, if she remains uninsured when she is due for refills to verify stock of medications at [facility] pharmacy.    Follow Up Plan:  -Pharmacist on 12/03/24 -PCP on 11/29/24  Alexa Thompson, PharmD, CPP Clinical Pharmacist [facility] [phone]    "

## 2024-12-08 ENCOUNTER — Ambulatory Visit: Payer: Self-pay

## 2024-12-08 VITALS — BP 123/78 | HR 84 | Temp 98.3°F | Wt 135.0 lb

## 2024-12-08 DIAGNOSIS — J3089 Other allergic rhinitis: Secondary | ICD-10-CM

## 2024-12-08 DIAGNOSIS — I10 Essential (primary) hypertension: Secondary | ICD-10-CM

## 2024-12-08 DIAGNOSIS — K219 Gastro-esophageal reflux disease without esophagitis: Secondary | ICD-10-CM

## 2024-12-08 DIAGNOSIS — J441 Chronic obstructive pulmonary disease with (acute) exacerbation: Secondary | ICD-10-CM

## 2024-12-08 DIAGNOSIS — E782 Mixed hyperlipidemia: Secondary | ICD-10-CM

## 2024-12-08 MED ORDER — LOSARTAN POTASSIUM-HCTZ 50-12.5 MG PO TABS: 1.0000 | ORAL_TABLET | Freq: Every day | ORAL | 3 refills | Status: AC

## 2024-12-08 MED ORDER — ROSUVASTATIN CALCIUM 10 MG PO TABS: 10.0000 mg | ORAL_TABLET | Freq: Every day | ORAL | 3 refills | Status: AC

## 2024-12-08 MED ORDER — BENZONATATE 100 MG PO CAPS: 200.0000 mg | ORAL_CAPSULE | Freq: Three times a day (TID) | ORAL | 0 refills | Status: AC | PRN

## 2024-12-08 NOTE — Patient Instructions (Addendum)
 Acid reflux: I recommend Pepcid as needed.  Nasal Congestion: I recommend Flonase (fluticasone ) nasal spray and Zyrtec (cetirizine) daily.

## 2024-12-08 NOTE — Progress Notes (Signed)
 "     Established patient visit   Patient: Alexa Thompson   DOB: 04-Feb-1961   64 y.o. Female  MRN: [id number] Visit Date: 12/08/2024  Today's healthcare provider: Isaiah DELENA Pepper, MD   Chief Complaint  Patient presents with   Follow-up    Cut back on her smoking Still has a cough sometimes it gets rough at night and sometimes first thing in the morning Is taking the over the counter for the cough to help with sleep   Subjective    HPI  Discussed the use of AI scribe software for clinical note transcription with the patient, who gave verbal consent to proceed.  History of Present Illness Alexa Thompson is a 64 year old female who presents with persistent cough.  She has experienced a persistent cough since at least December. Initial treatment with prednisone , an antibiotic, and Tessalon  Perles provided relief, particularly for the cough. Despite this, the cough persists and sometimes causes soreness around her ribs.  She uses a Breztri  inhaler twice daily, which she finds beneficial, and albuterol  as needed, especially when overheated at work, which can trigger coughing spells. She has significantly reduced her smoking, now smoking only a couple of cigarettes a day and abstaining from smoking at work.  She has a history of bronchitis and describes the taste of mucus when coughing as 'nastier' than usual. She occasionally uses Mucinex D and has previously used Claritin and Allegra D for allergies. Currently, she uses a generic version of Flonase but has reduced its use due to minor epistaxis.  She is on rosuvastatin  for cholesterol and losartan  for blood pressure, managing her blood pressure by taking half a tablet daily, with stable readings.   Medications: Show/hide medication list[1]  Review of Systems as noted in HPI.      Objective    BP 123/78   Pulse 84   Temp 98.3 F (36.8 C) (Oral)   Wt 135 lb (61.2 kg)   LMP  (LMP Unknown)   SpO2 92%   BMI 23.17 kg/m     Physical Exam Constitutional:      Appearance: Normal appearance.  HENT:     Head: Normocephalic and atraumatic.     Mouth/Throat:     Mouth: Mucous membranes are moist.  Eyes:     Pupils: Pupils are equal, round, and reactive to light.  Pulmonary:     Effort: Pulmonary effort is normal.  Skin:    General: Skin is warm.  Neurological:     General: No focal deficit present.     Mental Status: She is alert.      No results found for any visits on 12/08/24.  Assessment & Plan     Problem List Items Addressed This Visit       Cardiovascular and Mediastinum   Essential hypertension, benign   Relevant Medications   rosuvastatin  (CRESTOR ) 10 MG tablet   losartan -hydrochlorothiazide (HYZAAR) 50-12.5 MG tablet     Respiratory   Chronic obstructive pulmonary disease (HCC) - Primary   Relevant Medications   cetirizine (ZYRTEC) 10 MG tablet   benzonatate  (TESSALON ) 100 MG capsule   fluticasone  (FLONASE) 50 MCG/ACT nasal spray     Digestive   Gastroesophageal reflux disease     Other   Mixed hyperlipidemia   Relevant Medications   rosuvastatin  (CRESTOR ) 10 MG tablet   losartan -hydrochlorothiazide (HYZAAR) 50-12.5 MG tablet   Other Visit Diagnoses       Non-seasonal allergic rhinitis, unspecified trigger  Assessment & Plan Chronic obstructive pulmonary disease (HCC) Persistent cough with mucus production, exacerbated by environmental factors. Breztri  inhaler and albuterol  inhaler provides relief. - Continue Breztri  inhaler twice daily. - Refilled albuterol  inhaler as needed. - Prescribed Tessalon  Perles 30 tablets, take 1-2 tablets up to three times daily as needed. - Recommended over-the-counter cetirizine and decongestant for cough and mucus.  Essential hypertension Blood pressure well-controlled. Losartan  dose adjusted for convenience. - Prescribed losartan  50 mg/12.5 mg daily.  Mixed hyperlipidemia Chronic. Refilled rosuvastatin   prescription.  Gastroesophageal reflux disease Intermittent burning sensation and cough possibly related to GERD. Endoscopy considered for further evaluation. - Recommended Pepcid BID - Encouraged scheduling endoscopy after insurance coverage begins.  Allergic rhinitis Intermittent nasal congestion and sneezing, possibly exacerbated by environmental factors. Flonase used with minor epistaxis. - Recommended over-the-counter cetirizine for allergy symptoms. - Continue Flonase as needed, monitor for epistaxis.   Return in about 6 months (around 06/07/2025) for Follow Up.       Isaiah DELENA Pepper, MD  [facility] [phone] (phone) [phone] (fax)     [1]  Outpatient Medications Prior to Visit  Medication Sig   albuterol  (VENTOLIN  HFA) 108 (90 Base) MCG/ACT inhaler Inhale 2 puffs into the lungs every 6 (six) hours as needed for wheezing or shortness of breath.   budesonide-glycopyrrolate-formoterol (BREZTRI  AEROSPHERE) 160-9-4.8 MCG/ACT AERO inhaler Inhale 2 puffs into the lungs in the morning and at bedtime.   cetirizine (ZYRTEC) 10 MG tablet Take 10 mg by mouth daily.   fluticasone  (FLONASE) 50 MCG/ACT nasal spray Place 1 spray into both nostrils daily.   [DISCONTINUED] losartan -hydrochlorothiazide (HYZAAR) 100-25 MG tablet Take 0.5 tablets by mouth daily.   [DISCONTINUED] rosuvastatin  (CRESTOR ) 10 MG tablet Take 1 tablet (10 mg total) by mouth daily.   [DISCONTINUED] benzonatate  (TESSALON ) 100 MG capsule Take 2 capsules (200 mg total) by mouth 3 (three) times daily as needed for cough. (Patient not taking: Reported on 12/08/2024)   No facility-administered medications prior to visit.   "

## 2024-12-10 ENCOUNTER — Telehealth: Payer: Self-pay

## 2024-12-10 NOTE — Telephone Encounter (Signed)
Received incoming referral for the patient, the patient called in to schedule an appointment. I sent out an appointment reminder with a No-Show letter and a map. I verified and updated the information in her chart, including the insurance guarantor and other relevant details.

## 2025-01-11 ENCOUNTER — Ambulatory Visit: Payer: Self-pay

## 2025-06-07 ENCOUNTER — Ambulatory Visit: Payer: Self-pay
